# Patient Record
Sex: Male | Born: 1970 | Hispanic: No | Marital: Single | Smoking: Never smoker
Health system: Southern US, Community
[De-identification: ages and names within clinical notes are randomized; demographics above are authoritative.]

## PROBLEM LIST (undated history)

## (undated) HISTORY — PX: CERVICAL SPINE SURGERY: SHX589

---

## 2014-03-12 ENCOUNTER — Ambulatory Visit: Payer: Worker's Compensation | Attending: Orthopedic Surgery | Admitting: Physical Therapy

## 2014-03-12 DIAGNOSIS — M542 Cervicalgia: Secondary | ICD-10-CM | POA: Insufficient documentation

## 2014-03-12 DIAGNOSIS — IMO0001 Reserved for inherently not codable concepts without codable children: Secondary | ICD-10-CM | POA: Insufficient documentation

## 2014-03-20 ENCOUNTER — Ambulatory Visit: Payer: Worker's Compensation | Attending: Orthopedic Surgery | Admitting: Physical Therapy

## 2014-03-20 DIAGNOSIS — M542 Cervicalgia: Secondary | ICD-10-CM | POA: Diagnosis not present

## 2014-03-20 DIAGNOSIS — IMO0001 Reserved for inherently not codable concepts without codable children: Secondary | ICD-10-CM | POA: Insufficient documentation

## 2014-03-24 ENCOUNTER — Ambulatory Visit: Payer: Worker's Compensation | Attending: Orthopedic Surgery | Admitting: Physical Therapy

## 2014-03-24 DIAGNOSIS — M542 Cervicalgia: Secondary | ICD-10-CM | POA: Insufficient documentation

## 2014-03-24 DIAGNOSIS — IMO0001 Reserved for inherently not codable concepts without codable children: Secondary | ICD-10-CM | POA: Insufficient documentation

## 2014-03-25 ENCOUNTER — Ambulatory Visit: Payer: Worker's Compensation | Admitting: Physical Therapy

## 2014-04-01 ENCOUNTER — Ambulatory Visit: Payer: Worker's Compensation | Attending: Orthopedic Surgery | Admitting: Physical Therapy

## 2014-04-01 DIAGNOSIS — IMO0001 Reserved for inherently not codable concepts without codable children: Secondary | ICD-10-CM | POA: Diagnosis not present

## 2014-04-01 DIAGNOSIS — M542 Cervicalgia: Secondary | ICD-10-CM | POA: Insufficient documentation

## 2014-04-03 ENCOUNTER — Ambulatory Visit: Payer: Worker's Compensation | Admitting: Physical Therapy

## 2014-04-03 ENCOUNTER — Encounter: Payer: Self-pay | Admitting: Physical Therapy

## 2014-04-07 ENCOUNTER — Ambulatory Visit: Payer: Worker's Compensation | Attending: Orthopedic Surgery | Admitting: Physical Therapy

## 2014-04-07 DIAGNOSIS — IMO0001 Reserved for inherently not codable concepts without codable children: Secondary | ICD-10-CM | POA: Insufficient documentation

## 2014-04-07 DIAGNOSIS — M542 Cervicalgia: Secondary | ICD-10-CM | POA: Diagnosis not present

## 2014-04-09 ENCOUNTER — Ambulatory Visit: Payer: Worker's Compensation | Attending: Orthopedic Surgery | Admitting: Physical Therapy

## 2014-04-09 DIAGNOSIS — IMO0001 Reserved for inherently not codable concepts without codable children: Secondary | ICD-10-CM | POA: Insufficient documentation

## 2014-04-09 DIAGNOSIS — M542 Cervicalgia: Secondary | ICD-10-CM | POA: Diagnosis not present

## 2014-04-15 ENCOUNTER — Ambulatory Visit: Payer: Self-pay | Admitting: Physical Therapy

## 2014-04-17 ENCOUNTER — Encounter: Payer: Self-pay | Admitting: Physical Therapy

## 2014-06-18 ENCOUNTER — Ambulatory Visit: Payer: Worker's Compensation | Attending: Orthopedic Surgery | Admitting: Physical Therapy

## 2014-06-18 DIAGNOSIS — M25539 Pain in unspecified wrist: Secondary | ICD-10-CM | POA: Insufficient documentation

## 2014-06-18 DIAGNOSIS — M545 Low back pain, unspecified: Secondary | ICD-10-CM | POA: Diagnosis not present

## 2014-06-18 DIAGNOSIS — M542 Cervicalgia: Secondary | ICD-10-CM | POA: Diagnosis present

## 2014-06-20 ENCOUNTER — Ambulatory Visit: Payer: Worker's Compensation | Admitting: Physical Therapy

## 2014-06-26 ENCOUNTER — Ambulatory Visit: Payer: Worker's Compensation | Attending: Orthopedic Surgery | Admitting: Physical Therapy

## 2014-06-26 DIAGNOSIS — M545 Low back pain, unspecified: Secondary | ICD-10-CM | POA: Insufficient documentation

## 2014-06-26 DIAGNOSIS — M542 Cervicalgia: Secondary | ICD-10-CM | POA: Insufficient documentation

## 2014-06-26 DIAGNOSIS — IMO0001 Reserved for inherently not codable concepts without codable children: Secondary | ICD-10-CM | POA: Insufficient documentation

## 2014-06-26 DIAGNOSIS — M25539 Pain in unspecified wrist: Secondary | ICD-10-CM | POA: Diagnosis not present

## 2014-06-26 DIAGNOSIS — Z981 Arthrodesis status: Secondary | ICD-10-CM | POA: Diagnosis not present

## 2014-06-27 ENCOUNTER — Ambulatory Visit: Payer: Worker's Compensation | Admitting: Physical Therapy

## 2014-07-02 ENCOUNTER — Ambulatory Visit: Payer: Worker's Compensation | Admitting: Physical Therapy

## 2014-07-02 DIAGNOSIS — IMO0001 Reserved for inherently not codable concepts without codable children: Secondary | ICD-10-CM | POA: Diagnosis not present

## 2014-07-04 ENCOUNTER — Ambulatory Visit: Payer: Worker's Compensation | Admitting: Physical Therapy

## 2014-07-04 DIAGNOSIS — IMO0001 Reserved for inherently not codable concepts without codable children: Secondary | ICD-10-CM | POA: Diagnosis not present

## 2014-07-08 ENCOUNTER — Ambulatory Visit: Payer: Worker's Compensation | Admitting: Physical Therapy

## 2014-07-08 DIAGNOSIS — IMO0001 Reserved for inherently not codable concepts without codable children: Secondary | ICD-10-CM | POA: Diagnosis not present

## 2014-07-11 ENCOUNTER — Ambulatory Visit: Payer: Worker's Compensation | Admitting: Physical Therapy

## 2014-07-16 ENCOUNTER — Ambulatory Visit: Payer: Worker's Compensation | Attending: Orthopedic Surgery | Admitting: Physical Therapy

## 2014-07-16 DIAGNOSIS — M549 Dorsalgia, unspecified: Secondary | ICD-10-CM | POA: Insufficient documentation

## 2014-07-16 DIAGNOSIS — IMO0001 Reserved for inherently not codable concepts without codable children: Secondary | ICD-10-CM | POA: Insufficient documentation

## 2014-07-16 DIAGNOSIS — Z981 Arthrodesis status: Secondary | ICD-10-CM | POA: Insufficient documentation

## 2014-07-16 DIAGNOSIS — M542 Cervicalgia: Secondary | ICD-10-CM | POA: Diagnosis not present

## 2014-07-16 DIAGNOSIS — M25539 Pain in unspecified wrist: Secondary | ICD-10-CM | POA: Diagnosis not present

## 2014-07-21 ENCOUNTER — Ambulatory Visit: Payer: Worker's Compensation | Admitting: Physical Therapy

## 2014-07-21 DIAGNOSIS — IMO0001 Reserved for inherently not codable concepts without codable children: Secondary | ICD-10-CM | POA: Diagnosis not present

## 2014-07-23 ENCOUNTER — Ambulatory Visit: Payer: Worker's Compensation | Admitting: Physical Therapy

## 2014-07-23 DIAGNOSIS — IMO0001 Reserved for inherently not codable concepts without codable children: Secondary | ICD-10-CM | POA: Diagnosis not present

## 2014-07-25 ENCOUNTER — Ambulatory Visit: Payer: Worker's Compensation | Attending: Orthopedic Surgery | Admitting: Physical Therapy

## 2014-07-25 DIAGNOSIS — M545 Low back pain: Secondary | ICD-10-CM | POA: Diagnosis not present

## 2014-07-25 DIAGNOSIS — M25521 Pain in right elbow: Secondary | ICD-10-CM | POA: Diagnosis not present

## 2014-07-25 DIAGNOSIS — M542 Cervicalgia: Secondary | ICD-10-CM | POA: Insufficient documentation

## 2014-07-25 DIAGNOSIS — Z9889 Other specified postprocedural states: Secondary | ICD-10-CM | POA: Insufficient documentation

## 2014-07-30 ENCOUNTER — Encounter: Payer: Worker's Compensation | Admitting: Physical Therapy

## 2014-08-01 ENCOUNTER — Ambulatory Visit: Payer: Worker's Compensation | Admitting: Physical Therapy

## 2014-08-01 DIAGNOSIS — M542 Cervicalgia: Secondary | ICD-10-CM | POA: Diagnosis not present

## 2015-10-14 ENCOUNTER — Emergency Department (INDEPENDENT_AMBULATORY_CARE_PROVIDER_SITE_OTHER)
Admission: EM | Admit: 2015-10-14 | Discharge: 2015-10-14 | Disposition: A | Discharge status: Home / Self Care | Payer: Worker's Compensation | Source: Home / Self Care | Attending: Family Medicine | Admitting: Family Medicine

## 2015-10-14 ENCOUNTER — Encounter (HOSPITAL_COMMUNITY): Payer: Self-pay | Admitting: *Deleted

## 2015-10-14 DIAGNOSIS — Z4802 Encounter for removal of sutures: Secondary | ICD-10-CM | POA: Diagnosis not present

## 2015-10-14 MED ORDER — BACITRACIN ZINC 500 UNIT/GM EX OINT
TOPICAL_OINTMENT | CUTANEOUS | Status: AC
  Filled 2015-10-14: qty 2.7

## 2015-10-14 NOTE — ED Notes (Signed)
Pt here  For  Suture  Removal  r  Hand   -  Sutures  Have  Been in place  For  3  Weeks

## 2015-10-14 NOTE — ED Provider Notes (Signed)
CSN: [id number]     Arrival date & time 10/14/15  1641 History   First MD Initiated Contact with Patient 10/14/15 1753     Chief Complaint  Patient presents with  . Suture / Staple Removal   (Consider location/radiation/quality/duration/timing/severity/associated sxs/prior Treatment) Patient is a 44 y.o. male presenting with suture removal. The history is provided by the patient.  Suture / Staple Removal This is a new problem. The current episode started more than 1 week ago (carpal tunnel surg in [place] 3 wks ago, healed and no sx except sl numbness to right thumb otherwise nl. here for sr.). The problem has been resolved.    History reviewed. No pertinent past medical history. History reviewed. No pertinent past surgical history. History reviewed. No pertinent family history. Social History  Substance Use Topics  . Smoking status: None  . Smokeless tobacco: None  . Alcohol Use: No    Review of Systems  Skin: Positive for wound.  All other systems reviewed and are negative.   Allergies  Review of patient's allergies indicates no known allergies.  Home Medications   Prior to Admission medications   Not on File   Meds Ordered and Administered this Visit  Medications - No data to display  BP 126/78 mmHg  Pulse 78  Temp(Src) 98.6 F (37 C) (Oral)  Resp 18  SpO2 100% No data found.   Physical Exam  Constitutional: He is oriented to person, place, and time. He appears well-developed and well-nourished. No distress.  Musculoskeletal: Normal range of motion. He exhibits no tenderness.  Incision site healed, no problems. Sutures removed.  Neurological: He is alert and oriented to person, place, and time.  Skin: Skin is warm and dry.  Nursing note and vitals reviewed.   ED Course  Procedures (including critical care time)  Labs Review Labs Reviewed - No data to display  Imaging Review No results found.   Visual Acuity Review  Right Eye Distance:   Left Eye  Distance:   Bilateral Distance:    Right Eye Near:   Left Eye Near:    Bilateral Near:         MDM   1. Encounter for removal of sutures    Sutures removed without diff.    Linna HoffJames D Keelan Pomerleau, MD 10/14/15 [phone]

## 2016-01-13 ENCOUNTER — Ambulatory Visit: Payer: Worker's Compensation | Attending: Orthopedic Surgery | Admitting: Physical Therapy

## 2016-01-25 ENCOUNTER — Ambulatory Visit: Payer: Self-pay | Admitting: Physical Therapy

## 2016-01-25 ENCOUNTER — Encounter: Payer: Self-pay | Admitting: Physical Therapy

## 2016-01-25 ENCOUNTER — Ambulatory Visit: Payer: 59 | Attending: Orthopedic Surgery | Admitting: Physical Therapy

## 2016-01-25 DIAGNOSIS — M79641 Pain in right hand: Secondary | ICD-10-CM | POA: Diagnosis not present

## 2016-01-25 DIAGNOSIS — M25641 Stiffness of right hand, not elsewhere classified: Secondary | ICD-10-CM | POA: Insufficient documentation

## 2016-01-25 NOTE — Therapy (Signed)
[facility] [place] [address] [place], [place], [address] Phone: [phone]   Fax:  [phone]  Physical Therapy Evaluation  Patient Details  Name: Andre Pittman MRN: [id number] Date of Birth: 04/01/1971 Referring Provider: Lilian Kapur  Encounter Date: 01/25/2016      PT End of Session - 01/25/16 0918    Visit Number 1   PT Start Time 0845   PT Stop Time 0935   PT Time Calculation (min) 50 min   Activity Tolerance Patient limited by pain   Behavior During Therapy [facility] for tasks assessed/performed      History reviewed. No pertinent past medical history.  History reviewed. No pertinent past surgical history.  There were no vitals filed for this visit.  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  He has not done anything since the surgery as he reports he is having difficulty with the worker's comp claim to approve therapy.  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 Yes   Pain Score 9    Pain Location Hand   Pain Orientation Right   Pain Descriptors / Indicators Aching;Pins and needles   Pain Type Surgical pain   Pain Radiating Towards some numbness in the thumb and 2nd finger   Pain Onset More than a month ago   Pain Frequency Constant   Aggravating Factors  any movements   Pain Relieving Factors hot shower   Effect of Pain on Daily  Activities reports difficulty getting dressed and doing hair            [facility] PT Assessment - 01/25/16 0001    Assessment   Medical Diagnosis s/p right carpal tunnel release   Referring Provider Lilian Kapur   Onset Date/Surgical Date 09/20/16   Prior Therapy not for hand   Precautions   Precautions None   Balance Screen   Has the patient fallen in the past 6 months No   Has the patient had a decrease in activity level because of a fear of falling?  No   Is the patient reluctant to leave their home because of a fear of falling?  No   Home Environment   Additional Comments has an 36 month old, he cannot lift due to pain   Prior Function   Level of Independence Independent   Vocation On disability   Leisure no exercises   Posture/Postural Control   Posture Comments fwd head, rounded shoulders   ROM / Strength   AROM / PROM / Strength AROM   AROM   AROM Assessment Site Wrist;Elbow;Thumb   Right/Left Elbow Right   Right Elbow Flexion 120   Right Elbow Extension 15   Right/Left Wrist Right   Right Wrist Extension 50 Degrees   Right Wrist  Flexion 50 Degrees   Right/Left Thumb Right   Right Thumb Opposition Digit 5  unable    Strength   Overall Strength Comments right grip strength 35#, left 60#, right wrist and elbow 3+/5 due to pain in the wrist and elbow   Palpation   Palpation comment he is very tender, he has spasms in the upper traps and rhomboids, th thenar eminensce on the right is wasted compared to the left   Ambulation/Gait   Gait Comments he describes back pain with a left leg weakness and usually has to use a cane and uses it in the right hand, however since the surgery he has used it in the left hand and now reports increased pain and tightness in the left upper trap and rhomboid area                   [facility] Treatment/Exercise - 01/25/16 0001    Modalities   Modalities Electrical Stimulation;Moist Heat   Moist Heat Therapy   Number  Minutes Moist Heat 15 Minutes   Moist Heat Location Wrist   Electrical Stimulation   Electrical Stimulation Location left upper trap, right wrist   Electrical Stimulation Action premod   Electrical Stimulation Goals Pain                PT Education - 01/25/16 [phone]    Education provided Yes   Education Details HEP for wrist flexion and extension stretches, finger abduction and opposition   Person(s) Educated Patient   Methods Explanation;Demonstration;Handout   Comprehension Verbalized understanding;Returned demonstration          PT Short Term Goals - 01/25/16 1011    PT SHORT TERM GOAL #1   Title independent with initial HEP   Time 2   Period Weeks   Status New           PT Long Term Goals - 01/25/16 1011    PT LONG TERM GOAL #1   Title decrease pain 50%   Time 12   Period Weeks   Status New   PT LONG TERM GOAL #2   Title increase right grip strength to = the left   Time 12   Period Weeks   Status New   PT LONG TERM GOAL #3   Title increase right wrist AROM to WFL's   Time 12   Period Weeks   Status New   PT LONG TERM GOAL #4   Title report independence with dressing and doing hair   Time 12   Period Weeks   Status New               Plan - 01/25/16 1007    Clinical Impression Statement Patient underwent a right carpal tunnel release surgery on 09/21/15.  He reports that he has had issues getting PT approved through worker's comp, possibly due to him having MD's and surgeries in [place] with the W/C clainm but living here??  He reports that he has tried to move the hand some but is haivng a high rating of pain in the right hand, elbow and arm with some pain in the neck and left shoulder area, this may be due to him having to use his cane in his left hand now due to the surgery on the right hand.  He does have mm atrophy of the thenar eminensce   Pt will benefit from skilled therapeutic intervention in order to improve on the following deficits  Decreased range  of motion;Decreased scar mobility;Decreased strength;Increased edema;Increased muscle spasms;Postural dysfunction;Improper body mechanics;Impaired sensation;Impaired UE functional use;Pain   Rehab Potential Good   PT Frequency 2x / week   PT Duration 12 weeks   PT Treatment/Interventions ADLs/Self Care Home Management;Cryotherapy;Electrical Stimulation;Iontophoresis /ml Dexamethasone;Moist Heat;Therapeutic exercise;Therapeutic activities;Ultrasound;Patient/family education;Manual techniques;Passive range of motion   PT Next Visit Plan slowly add exercises and manual techniques   Consulted and Agree with Plan of Care Patient         Problem List There are no active problems to display for this patient.   Jearld Lesch., PT 01/25/2016, 11:46 AM  [facility]- [address] [place], [place], [address] Phone: [phone]   Fax:  [phone]  Name: Andre Pittman MRN: [id number] Date of Birth: 03/29/1971

## 2016-02-01 ENCOUNTER — Encounter: Payer: Self-pay | Admitting: Physical Therapy

## 2016-02-01 ENCOUNTER — Ambulatory Visit: Payer: Worker's Compensation | Attending: Orthopedic Surgery | Admitting: Physical Therapy

## 2016-02-01 DIAGNOSIS — M25641 Stiffness of right hand, not elsewhere classified: Secondary | ICD-10-CM

## 2016-02-01 DIAGNOSIS — M79641 Pain in right hand: Secondary | ICD-10-CM

## 2016-02-01 NOTE — Therapy (Signed)
[facility] [place] [address] [place], [place], [address] Phone: [phone]   Fax:  [phone]  Physical Therapy Treatment  Patient Details  Name: Andre Pittman MRN: [id number] Date of Birth: 24-Jan-1971 Referring Provider: Lilian Kapur  Encounter Date: 02/01/2016      PT End of Session - 02/01/16 0925    Visit Number 2   PT Start Time 0846   PT Stop Time 0940   PT Time Calculation (min) 54 min   Activity Tolerance Patient tolerated treatment well   Behavior During Therapy [facility] for tasks assessed/performed      History reviewed. No pertinent past medical history.  History reviewed. No pertinent past surgical history.  There were no vitals filed for this visit.      Subjective Assessment - 02/01/16 0847    Subjective No change since Eval   Currently in Pain? Yes   Pain Score 9    Pain Location --  R hand, upper traps and R elbow                         [facility] Adult PT Treatment/Exercise - 02/01/16 0001    Exercises   Exercises Wrist;Hand;Elbow   Elbow Exercises   Other elbow exercises Seated Tband rows green 2x15   Other elbow exercises Hammer curls #4 2x10; Pinches in web 2x10    Hand Exercises   Theraputty - Pinch 2x15   Other Hand Exercises orange egg squeezes 2x20    Other Hand Exercises Triceps Ext yellow Tband 2x15   Wrist Exercises   Wrist Radial Deviation 15 reps;Right  2 sets   Wrist Ulnar Deviation 15 reps;Right  2 sets    Other wrist exercises Wrist flexion #1 2x15; Wrist Ext #1 2x15   Other wrist exercises Pronation/ supination #1 2x10   Modalities   Modalities Electrical Stimulation;Moist Heat   Moist Heat Therapy   Number Minutes Moist Heat 15 Minutes   Moist Heat Location Wrist   Electrical Stimulation   Electrical Stimulation Location left upper trap, right wrist   Electrical Stimulation Action pre mod   Electrical Stimulation Goals Pain                   PT Short Term Goals - 01/25/16 1011    PT SHORT TERM GOAL #1   Title independent with initial HEP   Time 2   Period Weeks   Status New           PT Long Term Goals - 01/25/16 1011    PT LONG TERM GOAL #1   Title decrease pain 50%   Time 12   Period Weeks   Status New   PT LONG TERM GOAL #2   Title increase right grip strength to = the left   Time 12   Period Weeks   Status New   PT LONG TERM GOAL #3   Title increase right wrist AROM to WFL's   Time 12   Period Weeks   Status New   PT LONG TERM GOAL #4   Title report independence with dressing and doing hair   Time 12   Period Weeks   Status New               Plan - 02/01/16 5638    Clinical Impression Statement Pt tolerated a progression to gym level interventions well. Reports L Trap pain due to increase weight bearing in  LUE with SPC. Little wrist pain with today's interventions and demos good  R wrist AROM.   Rehab Potential Good   PT Frequency 2x / week   PT Duration 12 weeks   PT Treatment/Interventions ADLs/Self Care Home Management;Cryotherapy;Electrical Stimulation;Iontophoresis 4mg /ml Dexamethasone;Moist Heat;Therapeutic exercise;Therapeutic activities;Ultrasound;Patient/family education;Manual techniques;Passive range of motion   PT Next Visit Plan slowly add exercises and manual techniques      Patient will benefit from skilled therapeutic intervention in order to improve the following deficits and impairments:  Decreased range of motion, Decreased scar mobility, Decreased strength, Increased edema, Increased muscle spasms, Postural dysfunction, Improper body mechanics, Impaired sensation, Impaired UE functional use, Pain  Visit Diagnosis: Pain in right hand  Stiffness of right hand, not elsewhere classified     Problem List There are no active problems to display for this patient.   Grayce Sessionsonald G Malerie Eakins, PTA  02/01/2016, 9:29 AM  [facility] [place] [address] [place], [place], [address] Phone: [phone]   Fax:  [phone]  Name: Andre MusselJohnny Pittman MRN: [id number] Date of Birth: 07-27-1971

## 2016-02-08 ENCOUNTER — Ambulatory Visit: Payer: 59 | Admitting: Physical Therapy

## 2016-02-10 ENCOUNTER — Ambulatory Visit: Payer: 59 | Admitting: Physical Therapy

## 2016-02-10 ENCOUNTER — Encounter: Payer: Self-pay | Admitting: Physical Therapy

## 2016-02-10 DIAGNOSIS — M79641 Pain in right hand: Secondary | ICD-10-CM | POA: Diagnosis not present

## 2016-02-10 DIAGNOSIS — M25641 Stiffness of right hand, not elsewhere classified: Secondary | ICD-10-CM | POA: Diagnosis not present

## 2016-02-10 NOTE — Therapy (Signed)
[facility] [place] [address] [place], [place], [address] Phone: [phone]   Fax:  [phone]  Physical Therapy Treatment  Patient Details  Name: Andre Pittman MRN: [id number] Date of Birth: [birth year] Referring Provider: Lilian KapurNelson Bonawick  Encounter Date: 02/10/2016      PT End of Session - 02/10/16 0923    Visit Number 3   PT Start Time 0845   PT Stop Time 0925   PT Time Calculation (min) 40 min   Activity Tolerance Patient tolerated treatment well   Behavior During Therapy [facility] for tasks assessed/performed      History reviewed. No pertinent past medical history.  History reviewed. No pertinent past surgical history.  There were no vitals filed for this visit.          [facility] - 02/10/16 0001    Strength   Overall Strength Comments right grip strength 55#, left 75#, right wrist and elbow 4/5                     [facility] Adult PT Treatment/Exercise - 02/10/16 0001    Exercises   Exercises Wrist;Hand;Elbow   Elbow Exercises   Other elbow exercises Seated Tband rows green 2x15   Other elbow exercises Hammer curls #5 2x10; Pinches in web 2x15; wall pushups x10    Hand Exercises   Theraputty - Pinch 2x15   Other Hand Exercises UBE L2 3 frd/3rev; orange egg squeezes 2x20    Other Hand Exercises Tricept Ext red Tband 2x15   Wrist Exercises   Wrist Radial Deviation 15 reps;Right  2 sets   Wrist Ulnar Deviation 15 reps;Right  2 sets   Other wrist exercises Wrist flexion #1 2x15; Wrist Ext #1 2x15   Other wrist exercises Pronation/ supination #1 2x10                  PT Short Term Goals - 01/25/16 1011    PT SHORT TERM GOAL #1   Title independent with initial HEP   Time 2   Period Weeks   Status New           PT Long Term Goals - 01/25/16 1011    PT LONG TERM GOAL #1   Title decrease pain 50%   Time 12   Period Weeks   Status New   PT LONG TERM GOAL #2   Title increase right grip strength to = the left   Time 12   Period Weeks   Status New   PT LONG TERM GOAL #3   Title increase right wrist AROM to WFL's   Time 12   Period Weeks   Status New   PT LONG TERM GOAL #4   Title report independence with dressing and doing hair   Time 12   Period Weeks   Status New               Plan - 02/10/16 [id number]    Clinical Impression Statement Pt reports pain in R elbow with wrist exercises but able to push through. Pt has progressed with strength in R wrist and arm strength. Pt required increase time to complete task this date. Continuously reports back and neck pain throughout  treatment. Pt deny modality post treatment.   Rehab Potential Good   PT Frequency 2x / week   PT Duration 12 weeks   PT Treatment/Interventions ADLs/Self Care Home Management;Cryotherapy;Electrical Stimulation;Iontophoresis 4mg /ml Dexamethasone;Moist Heat;Therapeutic exercise;Therapeutic activities;Ultrasound;Patient/family education;Manual  techniques;Passive range of motion   PT Next Visit Plan slowly add exercises and manual techniques      Patient will benefit from skilled therapeutic intervention in order to improve the following deficits and impairments:  Decreased range of motion, Decreased scar mobility, Decreased strength, Increased edema, Increased muscle spasms, Postural dysfunction, Improper body mechanics, Impaired sensation, Impaired UE functional use, Pain  Visit Diagnosis: Pain in right hand  Stiffness of right hand, not elsewhere classified     Problem List There are no active problems to display for this patient.   Andre Pittman, PTA  02/10/2016, 9:27 AM  [facility] [place] [address] [place], [place], [address] Phone: [phone]   Fax:  [phone]  Name: Andre Pittman MRN: [id number] Date of Birth: 08-29-1971

## 2016-02-18 ENCOUNTER — Ambulatory Visit: Payer: 59 | Admitting: Physical Therapy

## 2016-02-22 ENCOUNTER — Ambulatory Visit: Payer: Worker's Compensation | Attending: Orthopedic Surgery | Admitting: Physical Therapy

## 2016-02-22 ENCOUNTER — Encounter: Payer: Self-pay | Admitting: Physical Therapy

## 2016-02-22 DIAGNOSIS — M25641 Stiffness of right hand, not elsewhere classified: Secondary | ICD-10-CM | POA: Diagnosis not present

## 2016-02-22 DIAGNOSIS — M79641 Pain in right hand: Secondary | ICD-10-CM | POA: Diagnosis not present

## 2016-02-22 NOTE — Therapy (Signed)
[facility] [place] [address] [place], [place], [address] Phone: [phone]   Fax:  [phone]  Physical Therapy Treatment  Patient Details  Name: Andre Pittman MRN: [id number] Date of Birth: [birth year] Referring Provider: Lilian KapurNelson Bonawick  Encounter Date: 02/22/2016      PT End of Session - 02/22/16 0841    Visit Number 4   PT Start Time 0800   PT Stop Time 0841   PT Time Calculation (min) 41 min   Activity Tolerance Patient tolerated treatment well;Patient limited by pain      History reviewed. No pertinent past medical history.  History reviewed. No pertinent past surgical history.  There were no vitals filed for this visit.      Subjective Assessment - 02/22/16 0803    Subjective Getting over this cold over the weekend   Currently in Pain? Yes   Pain Score 9    Pain Location Back            [facility] PT Assessment - 02/22/16 0001    AROM   Right/Left Elbow Right   Right Elbow Flexion 121   Right Elbow Extension 19   Right/Left Wrist Right   Right Wrist Extension 60 Degrees   Right Wrist Flexion 51 Degrees   Right/Left Thumb Right   Right Thumb Opposition Digit 5  unable but close   Strength   Overall Strength Comments right grip strength 30#, left 40#, right elbow 4/5; R wrist 4-/5                     [facility] Adult PT Treatment/Exercise - 02/22/16 0001    Exercises   Exercises Wrist;Hand;Elbow   Elbow Exercises   Other elbow exercises Seated Tband rows green 2x15   Other elbow exercises Hammer curls #5 2x10; Pinches in web 2x15;   Hand Exercises   Theraputty - Pinch 2x15   Other Hand Exercises UBE L2 3 frd/3rev; orange egg squeezes 2x20    Other Hand Exercises Tricept Ext red Tband 2x15   Wrist Exercises   Wrist Radial Deviation 15 reps;Right  2 sets yellow tband   Wrist Ulnar Deviation 15 reps;Right  2 sets yellow tband   Other wrist exercises Wrist flexion #2 2x15; Wrist Ext  #2 2x15   Other wrist exercises Velcro board x5 with 2 types of rollers                   PT Short Term Goals - 01/25/16 1011    PT SHORT TERM GOAL #1   Title independent with initial HEP   Time 2   Period Weeks   Status New           PT Long Term Goals - 01/25/16 1011    PT LONG TERM GOAL #1   Title decrease pain 50%   Time 12   Period Weeks   Status New   PT LONG TERM GOAL #2   Title increase right grip strength to = the left   Time 12   Period Weeks   Status New   PT LONG TERM GOAL #3   Title increase right wrist AROM to WFL's   Time 12   Period Weeks   Status New   PT LONG TERM GOAL #4   Title report independence with dressing and doing hair   Time 12   Period Weeks   Status New  Plan - 02/22/16 1610    Clinical Impression Statement Pt with reports of pain throughout treatment. Unable to perform seated chest press due to R elbow pain. Pt also unable to perform seated rows due to low back pain. Pt reports that he does avoid using R wrist/arm at time due to a numb sensation. Pt grip strength has both decrease with both UE.  Pt advises to uses R UE more at home.   PT Frequency 2x / week   PT Duration 12 weeks   PT Treatment/Interventions ADLs/Self Care Home Management;Cryotherapy;Electrical Stimulation;Iontophoresis /ml Dexamethasone;Moist Heat;Therapeutic exercise;Therapeutic activities;Ultrasound;Patient/family education;Manual techniques;Passive range of motion   PT Next Visit Plan R wrist strenght and ROM      Patient will benefit from skilled therapeutic intervention in order to improve the following deficits and impairments:  Decreased range of motion, Decreased scar mobility, Decreased strength, Increased edema, Increased muscle spasms, Postural dysfunction, Improper body mechanics, Impaired sensation, Impaired UE functional use, Pain  Visit Diagnosis: Pain in right hand  Stiffness of right hand, not elsewhere  classified     Problem List There are no active problems to display for this patient.   Grayce Sessions, PTA  02/22/2016, 8:46 AM  [facility] [place] [address] [place], [place], [address] Phone: [phone]   Fax:  [phone]  Name: Andre Pittman MRN: [id number] Date of Birth: August 12, 1971

## 2016-02-29 ENCOUNTER — Ambulatory Visit: Payer: Worker's Compensation | Admitting: Physical Therapy

## 2016-03-16 ENCOUNTER — Ambulatory Visit: Payer: Worker's Compensation | Admitting: Physical Therapy

## 2016-03-16 ENCOUNTER — Encounter: Payer: Self-pay | Admitting: Physical Therapy

## 2016-03-16 DIAGNOSIS — M25641 Stiffness of right hand, not elsewhere classified: Secondary | ICD-10-CM

## 2016-03-16 DIAGNOSIS — M79641 Pain in right hand: Secondary | ICD-10-CM

## 2016-03-16 NOTE — Therapy (Signed)
[place], [place], [address] Phone: [phone]   Fax:  [phone]  Physical Therapy Treatment  Patient Details  Name: Andre Pittman MRN: [id number] Date of Birth: Dec 18, 1970 Referring Provider: Shon Millet  Encounter Date: 03/16/2016      PT End of Session - 03/16/16 0828    Visit Number 5   PT Start Time 0805   PT Stop Time 0900   PT Time Calculation (min) 55 min   Activity Tolerance Patient tolerated treatment well;Patient limited by pain   Behavior During Therapy [facility] for tasks assessed/performed      History reviewed. No pertinent past medical history.  History reviewed. No pertinent past surgical history.  There were no vitals filed for this visit.      Subjective Assessment - 03/16/16 0817    Subjective Patient reports that the wet and cooler weather really is bothering his hand.  He reports that he has numbness and tingling in the hand even with using the TV remote.  He continues to report right elbow pain.   Currently in Pain? Yes   Pain Score 9    Pain Location Hand   Pain Orientation Right   Pain Descriptors / Indicators Aching;Pins and needles   Pain Radiating Towards some numbness in the right thumb and c/o weakness   Aggravating Factors  motions and cold weather, he continues to c/o pain in th eright elbow.   Pain Relieving Factors heat            [facility] PT Assessment - 03/16/16 0001    AROM   Right Wrist Extension 58 Degrees   Right Wrist Flexion 55 Degrees   Strength   Overall Strength Comments right grip 35#                     [facility] Adult PT Treatment/Exercise - 03/16/16 0001    Elbow Exercises   Other elbow exercises Seated Tband rows green 2x15   Other elbow exercises Hammer curls #5 2x10; Pinches in web 2x15;   Hand Exercises   Theraputty - Pinch 2x15   Other Hand Exercises Nustep Level 4 x 5 minutes   Wrist Exercises   Wrist  Radial Deviation 15 reps;Right   Wrist Ulnar Deviation 15 reps;Right   Other wrist exercises Wrist flexion #2 2x15; Wrist Ext #2 2x15   Other wrist exercises Velcro board x5 with 2 types of rollers , hammer UD and RD, hammer pronation and supination   Moist Heat Therapy   Number Minutes Moist Heat 15 Minutes   Moist Heat Location Wrist   Electrical Stimulation   Electrical Stimulation Location right wrist   Electrical Stimulation Action IFC   Electrical Stimulation Goals Pain                  PT Short Term Goals - 03/16/16 0831    PT SHORT TERM GOAL #1   Title independent with initial HEP   Status Achieved           PT Long Term Goals - 03/16/16 0831    PT LONG TERM GOAL #1   Title decrease pain 50%   Status On-going   PT LONG TERM GOAL #2   Title increase right grip strength to = the left   Status On-going   PT LONG TERM GOAL #3   Title increase right wrist AROM to WFL's   Status On-going   PT LONG  TERM GOAL #4   Title report independence with dressing and doing hair   Status Partially Met               Plan - 03/16/16 0829    Clinical Impression Statement Patient continues to have pain in the right hand with some numbness, he also c/o pain in the right elbow.  His ROM has very slowly improved some but is still limited, the right grip strength is about the same as at evaluation with pain   Consulted and Agree with Plan of Care Patient      Patient will benefit from skilled therapeutic intervention in order to improve the following deficits and impairments:  Decreased range of motion, Decreased scar mobility, Decreased strength, Increased edema, Increased muscle spasms, Postural dysfunction, Improper body mechanics, Impaired sensation, Impaired UE functional use, Pain  Visit Diagnosis: Pain in right hand  Stiffness of right hand, not elsewhere classified     Problem List There are no active problems to display for this  patient.   Sumner Boast., PT 03/16/2016, 8:37 AM  [place], [place], [address] Phone: [phone]   Fax:  [phone]  Name: Hilding Quintanar MRN: [id number] Date of Birth: 11-20-1970

## 2016-05-01 ENCOUNTER — Emergency Department (HOSPITAL_COMMUNITY)
Admission: EM | Admit: 2016-05-01 | Discharge: 2016-05-01 | Disposition: A | Discharge status: Home / Self Care | Payer: Commercial Managed Care - HMO | Attending: Emergency Medicine | Admitting: Emergency Medicine

## 2016-05-01 ENCOUNTER — Encounter (HOSPITAL_COMMUNITY): Payer: Self-pay | Admitting: *Deleted

## 2016-05-01 DIAGNOSIS — Y999 Unspecified external cause status: Secondary | ICD-10-CM | POA: Insufficient documentation

## 2016-05-01 DIAGNOSIS — S01112A Laceration without foreign body of left eyelid and periocular area, initial encounter: Secondary | ICD-10-CM | POA: Diagnosis not present

## 2016-05-01 DIAGNOSIS — Y939 Activity, unspecified: Secondary | ICD-10-CM | POA: Diagnosis not present

## 2016-05-01 DIAGNOSIS — Y929 Unspecified place or not applicable: Secondary | ICD-10-CM | POA: Diagnosis not present

## 2016-05-01 DIAGNOSIS — S0181XA Laceration without foreign body of other part of head, initial encounter: Secondary | ICD-10-CM

## 2016-05-01 DIAGNOSIS — W228XXA Striking against or struck by other objects, initial encounter: Secondary | ICD-10-CM | POA: Diagnosis not present

## 2016-05-01 NOTE — Discharge Instructions (Signed)
Keep wound clean with mild soap and water. Keep area covered with a topical bandage, keep bandage dry, and do not submerge in water for 24 hours. DO NOT USE CREAMS OR OINTMENTS ON THE WOUND! Ice and elevate for additional pain relief and swelling. Alternate between Ibuprofen and Tylenol for additional pain relief. Follow up with [facility] or use the list of doctors below to find a regular doctor in approximately 7 days for wound recheck and to establish medical care. Monitor area for signs of infection to include, but not limited to: increasing pain, spreading redness, drainage/pus, worsening swelling, or fevers. Return to emergency department for emergent changing or worsening symptoms.   Facial Laceration A facial laceration is a cut on the face. These injuries can be painful and cause bleeding. Some cuts may need to be closed with stitches (sutures), skin adhesive strips, or wound glue. Cuts usually heal quickly but can leave a scar. It can take 1-2 years for the scar to go away completely. HOME CARE   Only take medicines as told by your doctor.  Follow your doctor's instructions for wound care. For Stitches:  Keep the cut clean and dry.  If you have a bandage (dressing), change it at least once a day. Change the bandage if it gets wet or dirty, or as told by your doctor.  Wash the cut with soap and water 2 times a day. Rinse the cut with water. Pat it dry with a clean towel.  Put a thin layer of medicated cream on the cut as told by your doctor.  You may shower after the first 24 hours. Do not soak the cut in water until the stitches are removed.  Have your stitches removed as told by your doctor.  Do not wear any makeup until a few days after your stitches are removed. For Skin Adhesive Strips:  Keep the cut clean and dry.  Do not get the strips wet. You may take a bath, but be careful to keep the cut dry.  If the cut gets wet, pat it dry with a clean towel.  The  strips will fall off on their own. Do not remove the strips that are still stuck to the cut. For Wound Glue:  You may shower or take baths. Do not soak or scrub the cut. Do not swim. Avoid heavy sweating until the glue falls off on its own. After a shower or bath, pat the cut dry with a clean towel.  Do not put medicine or makeup on your cut until the glue falls off.  If you have a bandage, do not put tape over the glue.  Avoid lots of sunlight or tanning lamps until the glue falls off.  The glue will fall off on its own in 5-10 days. Do not pick at the glue. After Healing:  Put sunscreen on the cut for the first year to reduce your scar. GET HELP IF:  You have a fever. GET HELP RIGHT AWAY IF:   Your cut area gets red, painful, or puffy (swollen).  You see a yellowish-white fluid (pus) coming from the cut.   This information is not intended to replace advice given to you by your health care provider. Make sure you discuss any questions you have with your health care provider.   Document Released: 03/28/2008 Document Revised: 10/31/2014 Document Reviewed: 05/23/2013 Elsevier Interactive Patient Education 2016 [employer].  Stitches, [place], or Adhesive Wound Closure Doctors use stitches (sutures), staples, and  certain glue (skin adhesives) to hold your skin together while it heals (wound closure). You may need this treatment after you have surgery or if you cut your skin accidentally. These methods help your skin heal more quickly. They also make it less likely that you will have a scar. WHAT ARE THE DIFFERENT KINDS OF WOUND CLOSURES? There are many options for wound closure. The one that your doctor uses depends on how deep and large your wound is. Adhesive Glue To use this glue to close a wound, your doctor holds the edges of the wound together and paints the glue on the surface of your skin. You may need more than one layer of glue. Then the wound may be covered with a light  bandage (dressing). This type of skin closure may be used for small wounds that are not deep (superficial). Using glue for wound closure is less painful than other methods. It does not require a medicine that numbs the area. This method also leaves nothing to be removed. Adhesive glue is often used for children and on facial wounds. Adhesive glue cannot be used for wounds that are deep, uneven, or bleeding. It is not used inside of a wound.  Adhesive Strips These strips are made of sticky (adhesive), porous paper. They are placed across your skin edges like a regular adhesive bandage. You leave them on until they fall off. Adhesive strips may be used to close very superficial wounds. They may also be used along with sutures to improve closure of your skin edges.  Sutures Sutures are the oldest method of wound closure. Sutures can be made from natural or synthetic materials. They can be made from a material that your body can break down as your wound heals (absorbable), or they can be made from a material that needs to be removed from your skin (nonabsorbable). They come in many different strengths and sizes. Your doctor attaches the sutures to a steel needle on one end. Sutures can be passed through your skin, or through the tissues beneath your skin. Then they are tied and cut. Your skin edges may be closed in one continuous stitch or in separate stitches. Sutures are strong and can be used for all kinds of wounds. Absorbable sutures may be used to close tissues under the skin. The disadvantage of sutures is that they may cause skin reactions that lead to infection. Nonabsorbable sutures need to be removed. Staples When surgical staples are used to close a wound, the edges of your skin on both sides of the wound are brought close together. A staple is placed across the wound, and an instrument secures the edges together. Staples are often used to close surgical cuts (incisions). Staples are faster to  use than sutures, and they cause less reaction from your skin. Staples need to be removed using a tool that bends the staples away from your skin. HOW DO I CARE FOR MY WOUND CLOSURE?  Take medicines only as told by your doctor.  If you were prescribed an antibiotic medicine for your wound, finish it all even if you start to feel better.  Use ointments or creams only as told by your doctor.  Wash your hands with soap and water before and after touching your wound.  Do not soak your wound in water. Do not take baths, swim, or use a hot tub until your doctor says it is okay.  Ask your doctor when you can start showering. Cover your wound if told by your  doctor.  Do not take out your own sutures or staples.  Do not pick at your wound. Picking can cause an infection.  Keep all follow-up visits as told by your doctor. This is important. HOW LONG WILL I HAVE MY WOUND CLOSURE?   Leave adhesive glue on your skin until the glue peels away.  Leave adhesive strips on your skin until they fall off.  Absorbable sutures will dissolve within several days.  Nonabsorbable sutures and staples must be removed. The location of the wound will determine how long they stay in. This can range from several days to a couple of weeks. WHEN SHOULD I SEEK HELP FOR MY WOUND CLOSURE? Contact your doctor if:  You have a fever.  You have chills.  You have redness, puffiness (swelling), or pain at the site of your wound.  You have fluid, blood, or pus coming from your wound.  There is a bad smell coming from your wound.  The skin edges of your wound start to separate after your sutures have been removed.  Your wound becomes thick, raised, and darker in color after your sutures come out (scarring).   This information is not intended to replace advice given to you by your health care provider. Make sure you discuss any questions you have with your health care provider.   Document Released: 08/07/2009  Document Revised: 10/31/2014 Document Reviewed: 03/19/2014 Elsevier Interactive Patient Education 2016 [employer].   Emergency Department Resource Guide 1) Find a Doctor and Pay Out of Pocket Although you won't have to find out who is covered by your insurance plan, it is a good idea to ask around and get recommendations. You will then need to call the office and see if the doctor you have chosen will accept you as a new patient and what types of options they offer for patients who are self-pay. Some doctors offer discounts or will set up payment plans for their patients who do not have insurance, but you will need to ask so you aren't surprised when you get to your appointment.  2) Contact Your Local Health Department Not all health departments have doctors that can see patients for sick visits, but many do, so it is worth a call to see if yours does. If you don't know where your local health department is, you can check in your phone book. The CDC also has a tool to help you locate your state's health department, and many state websites also have listings of all of their local health departments.  3) Find a Walk-in Clinic If your illness is not likely to be very severe or complicated, you may want to try a walk in clinic. These are popping up all over the country in pharmacies, drugstores, and shopping centers. They're usually staffed by nurse practitioners or physician assistants that have been trained to treat common illnesses and complaints. They're usually fairly quick and inexpensive. However, if you have serious medical issues or chronic medical problems, these are probably not your best option.  No Primary Care Doctor: - Call Health Connect at  [phone] - they can help you locate a primary care doctor that  accepts your insurance, provides certain services, etc. - Physician Referral Service- [phone]  Chronic Pain Problems: Organization         Address  Phone   Notes  [facility]  [phone] Patients need to be referred by their primary care doctor.   Medication Assistance: Organization  Address  Phone   Notes  [facility] Medication [facility] [address] [place], [place] [address] [phone] --Must be a resident of [facility] -- Must have NO insurance coverage whatsoever (no Medicaid/ Medicare, etc.) -- The pt. MUST have a primary care doctor that directs their care regularly and follows them in the community   [facility]  [phone]   [employer]  [phone]    Agencies that provide inexpensive medical care: Organization         Address  Phone   Notes  [facility]  [phone]   [facility]    [phone]   [facility] [address] [place], [place] [address] [phone]   [facility] [address], [place] [phone]   [facility]    [phone]   [facility]    [phone]   [facility]  [address], [place] Phone:  [phone], Fax:  [phone] Hours of Operation:  9 am - 6 pm, M-F.  Also accepts Medicaid/Medicare and self-pay.  [facility]  [address], [place] Phone: [phone], Fax: [phone]. Hours of Operation:  8:30 am - 5:30 pm, M-F.  Also accepts Medicaid and self-pay.  [facility] [place] [address], [place] Phone: [phone]   [facility] [address] Natasha Bence [place], [place] [phone], Ext. [phone] Mondays & Thursdays: 7-9 AM.  First 15 patients are seen on a first come, first serve basis.    Medicaid-accepting [facility] Providers:  Organization         Address  Phone   Notes  [facility] [address], [place] [phone] Also accepts self-pay patients.  [facility] [address] Laurell Josephs [place], [place]  [phone]   [facility] [address], [place] [phone]   [facility] [address] [place] [phone]   Renaye Rakers [address], [place]   [phone] Only accepts [place] patients after they have their name applied to their card.   Self-Pay (no insurance) in [facility]:  Organization         Address  Phone   Notes  Sickle Cell Patients, [facility] [address] [place], [place] [phone]   [facility] [address] [place], [place] [phone]   [facility]   [address],  [phone]   [facility]/Dr. Osei-Bonsu  [address] [place] or [address], [place] [phone] Phone number for both [place] and [place] locations is the same.  [facility] [address], [place] [phone]   [facility] [address], [place] or [address] [phone]   [facility] [address] [place] [phone], phone; [phone], fax Sees patients 1st and 3rd Saturday of every month.  Must not qualify for public or private insurance (i.e. Medicaid, Medicare, [address] Health Choice, Veterans' Benefits)  Household income should be no more than 200% of the poverty level The clinic cannot treat you if you are pregnant or think you are  pregnant  Sexually transmitted diseases are not treated at the clinic.

## 2016-05-01 NOTE — ED Provider Notes (Signed)
CSN: [id number]     Arrival date & time 05/01/16  8657 History  By signing my name below, I, Soijett Blue, attest that this documentation has been prepared under the direction and in the presence of [employer], [employer] Electronically Signed: Soijett Blue, ED Scribe. 05/01/2016. 10:09 AM.   Chief Complaint  Patient presents with  . Facial Laceration      Patient is a 45 y.o. male presenting with skin laceration. The history is provided by the patient. No language interpreter was used.  Laceration Location:  Face Facial laceration location:  L eyebrow Length (cm):  1.5 cm  Depth:  Cutaneous Quality: straight   Bleeding: controlled   Time since incident: PTA. Injury mechanism: elbow. Pain details:    Quality: stinging.   Severity:  Mild   Timing:  Intermittent   Progression:  Unchanged Foreign body present:  No foreign bodies Relieved by:  Pressure Exacerbated by: air. Ineffective treatments:  None tried Tetanus status:  Up to date   Andre Pittman is a 45 y.o. male with no significant medical problems, who presents to the Emergency department complaining of facial laceration onset PTA. He reports that he was playing with his younger child when his older child jumped on the pt and accidentally struck the pt in the left eyebrow with his elbow. Pt reports mild pain to the wound, describes as 3-4/10, stinging sensation at the wound, intermittent, and it doesn't radiate. He states that air flow past the wound worsens his pain. Pt notes that his last tetanus vaccination was 4-5 years ago. He reports that he has tried a bandage to the area without medications for the relief for his symptoms. He denies vision change, facial pain, blurred vision, dental pain/loosening, LOC, bleeding conditions/anticoagulant use/easy bruising, HA, CP, SOB, abdominal pain, n/v, neck pain, back pain, numbness, tingling, weakness, and any other symptoms. Pt denies taking blood thinners.   No past medical  history on file. No past surgical history on file. No family history on file. Social History  Substance Use Topics  . Smoking status: Not on file  . Smokeless tobacco: Not on file  . Alcohol Use: No    Review of Systems  HENT: Positive for facial swelling (L eyebrow, minimal). Negative for dental problem.   Eyes: Negative for visual disturbance.  Respiratory: Negative for shortness of breath.   Cardiovascular: Negative for chest pain.  Gastrointestinal: Negative for nausea, vomiting and abdominal pain.  Musculoskeletal: Negative for back pain and neck pain.  Skin: Positive for wound (laceration to left eyelid).  Allergic/Immunologic: Negative for immunocompromised state.  Neurological: Negative for syncope, weakness, light-headedness, numbness and headaches.  Hematological: Does not bruise/bleed easily.  Psychiatric/Behavioral: Negative for confusion.   A complete 10 system review of systems was obtained and all systems are negative except as noted in the HPI and PMH.    Allergies  Review of patient's allergies indicates no known allergies.  Home Medications   Prior to Admission medications   Not on File   BP 113/77 mmHg  Pulse 70  Temp(Src) 98.2 F (36.8 C) (Oral)  Resp 16  SpO2 97% Physical Exam  Constitutional: He is oriented to person, place, and time. Vital signs are normal. He appears well-developed and well-nourished.  Non-toxic appearance. No distress.  Afebrile, nontoxic, NAD  HENT:  Head: Normocephalic. Head is with laceration. Head is without raccoon's eyes, without Battle's sign and without contusion.  Mouth/Throat: Oropharynx is clear and moist and mucous membranes are normal.  Superficial ~  1.5 cm linear laceration to the left eyebrow with minimal swelling, no bruising or contusions, no battle sign or racoon eyes, no scalp or facial tenderness or crepitus. SEE PICTURE BELOW  Eyes: Conjunctivae and EOM are normal. Pupils are equal, round, and reactive to  light. Right eye exhibits no discharge. Left eye exhibits no discharge.  PERRL, EOMI, no nystagmus, no visual field deficits   Neck: Normal range of motion. Neck supple. No spinous process tenderness and no muscular tenderness present. No rigidity. Normal range of motion present.  FROM intact without spinous process TTP, no bony stepoffs or deformities, no paraspinous muscle TTP or muscle spasms. No rigidity or meningeal signs. No bruising or swelling.   Cardiovascular: Normal rate and intact distal pulses.   Pulmonary/Chest: Effort normal. No respiratory distress.  Abdominal: Normal appearance. He exhibits no distension.  Musculoskeletal: Normal range of motion.  MAE x4 Strength and sensation grossly intact Distal pulses intact Gait steady  Neurological: He is alert and oriented to person, place, and time. He has normal strength. No cranial nerve deficit or sensory deficit. Coordination and gait normal. GCS eye subscore is 4. GCS verbal subscore is 5. GCS motor subscore is 6.  CN 2-12 grossly intact A&O x4 GCS 15 Sensation and strength intact Gait nonataxic Coordination WNL  Skin: Skin is warm and dry. Laceration noted. No rash noted.  Left eyebrow laceration as noted above and pictured below.   Psychiatric: He has a normal mood and affect.  Nursing note and vitals reviewed.     ED Course  .Marland KitchenLaceration Repair Date/Time: 05/01/2016 10:16 AM Performed by: Allen Derry Authorized by: Allen Derry Consent: Verbal consent obtained. Risks and benefits: risks, benefits and alternatives were discussed Consent given by: patient Patient understanding: patient states understanding of the procedure being performed Patient consent: the patient's understanding of the procedure matches consent given Patient identity confirmed: verbally with patient Body area: head/neck Location details: left eyebrow Laceration length: 1.5 cm Foreign bodies: no foreign bodies Tendon  involvement: none Nerve involvement: none Vascular damage: no Patient sedated: no Preparation: Patient was prepped and draped in the usual sterile fashion. Irrigation solution: saline Irrigation method: syringe Amount of cleaning: standard Debridement: none Degree of undermining: none Skin closure: glue Approximation: close Approximation difficulty: simple Dressing: 4x4 sterile gauze Patient tolerance: Patient tolerated the procedure well with no immediate complications   (including critical care time) DIAGNOSTIC STUDIES: Oxygen Saturation is 97% on RA, nl by my interpretation.    COORDINATION OF CARE: 10:09 AM Discussed treatment plan with pt at bedside which includes laceration repair with dermabond and pt agreed to plan.     MDM   Final diagnoses:  Facial laceration, initial encounter     45 y.o. male here with L eyebrow lac sustained PTA. UTD on tetanus. No ongoing bleeding. No HA/vision changes/LOC, no focal neuro deficits, doubt need for head imaging. Linear lac will be dermabonded since this will provide minimal scarring. Will reassess after dermabond.  10:22 AM Dermabond successful. Discussed ice/tylenol/motrin for pain/swelling. F/up with [facility] in 1wk to establish care and recheck wound. Doubt need for prophylactic abx. Discussed wound care and sunscreen use starting 10 days from now. I explained the diagnosis and have given explicit precautions to return to the ER including for any other new or worsening symptoms. The patient understands and accepts the medical plan as it's been dictated and I have answered their questions. Discharge instructions concerning home care and prescriptions have been given. The patient is STABLE and  is discharged to home in good condition.   I personally performed the services described in this documentation, which was scribed in my presence. The recorded information has been reviewed and is accurate.  BP 113/77 mmHg  Pulse 70  Temp(Src)  98.2 F (36.8 C) (Oral)  Resp 16  SpO2 97%  No orders of the defined types were placed in this encounter.      Blondina Coderre Camprubi-Soms, PA-C 05/01/16 [address]1025  Iolanda Folson Cypressamprubi-Soms, PA-C 05/01/16 1026  Margarita Grizzleanielle Ray, MD 05/01/16 1734

## 2016-05-01 NOTE — ED Notes (Deleted)
Declined W/C at D/C and was escorted to lobby by RN. 

## 2016-05-01 NOTE — ED Notes (Signed)
Declined W/C at D/C and was escorted to lobby by RN. 

## 2016-05-01 NOTE — ED Notes (Signed)
Pt reports he was it above the Lt eye brow with an elbow. Pt has small lac above lt eye brow. Bleeding controled.

## 2016-07-21 DIAGNOSIS — M542 Cervicalgia: Secondary | ICD-10-CM | POA: Diagnosis not present

## 2016-08-17 ENCOUNTER — Emergency Department (HOSPITAL_COMMUNITY): Payer: Commercial Managed Care - HMO

## 2016-08-17 ENCOUNTER — Emergency Department (HOSPITAL_COMMUNITY)
Admission: EM | Admit: 2016-08-17 | Discharge: 2016-08-17 | Disposition: A | Discharge status: Home / Self Care | Payer: Commercial Managed Care - HMO | Attending: Emergency Medicine | Admitting: Emergency Medicine

## 2016-08-17 ENCOUNTER — Encounter (HOSPITAL_COMMUNITY): Payer: Self-pay

## 2016-08-17 DIAGNOSIS — M7022 Olecranon bursitis, left elbow: Secondary | ICD-10-CM | POA: Diagnosis not present

## 2016-08-17 DIAGNOSIS — Y929 Unspecified place or not applicable: Secondary | ICD-10-CM | POA: Diagnosis not present

## 2016-08-17 DIAGNOSIS — Y999 Unspecified external cause status: Secondary | ICD-10-CM | POA: Diagnosis not present

## 2016-08-17 DIAGNOSIS — Y939 Activity, unspecified: Secondary | ICD-10-CM | POA: Insufficient documentation

## 2016-08-17 DIAGNOSIS — W19XXXA Unspecified fall, initial encounter: Secondary | ICD-10-CM | POA: Insufficient documentation

## 2016-08-17 DIAGNOSIS — M25522 Pain in left elbow: Secondary | ICD-10-CM | POA: Diagnosis not present

## 2016-08-17 DIAGNOSIS — S59902A Unspecified injury of left elbow, initial encounter: Secondary | ICD-10-CM | POA: Diagnosis not present

## 2016-08-17 DIAGNOSIS — M7989 Other specified soft tissue disorders: Secondary | ICD-10-CM | POA: Diagnosis not present

## 2016-08-17 MED ORDER — MELOXICAM 15 MG PO TABS: 15.0000 mg | ORAL_TABLET | Freq: Every day | ORAL | 0 refills | Status: AC

## 2016-08-17 NOTE — ED Triage Notes (Signed)
Patient complains of left elbow pain with swelling x 2 months post fall, pain with ROM

## 2016-08-17 NOTE — ED Provider Notes (Signed)
[facility]-EMERGENCY DEPT Provider Note   CSN: [id number] Arrival date & time: 08/17/16  1326  By signing my name below, I, Andre Pittman, attest that this documentation has been prepared under the direction and in the presence of Andre Symmonds, PA-C. Electronically Signed: Sonum Pittman, Neurosurgeoncribe. 08/17/16. 2:43 PM.  History   Chief Complaint No chief complaint on file.   The history is provided by the patient. No language interpreter was used.     HPI Comments: Andre Pittman is a 45 y.o. male who presents to the Emergency Department complaining of persistent left elbow pain with swelling for the past 2 months. He states he initially injured the area after a fall. He has chronic pain for which he takes Norco which provides him relief. He states the pain is worse with certain movements. He denies weakness, numbness.   History reviewed. No pertinent past medical history.  There are no active problems to display for this patient.   History reviewed. No pertinent surgical history.     Home Medications    Prior to Admission medications   Not on File    Family History No family history on file.  Social History Social History  Substance Use Topics  . Smoking status: Never Smoker  . Smokeless tobacco: Never Used  . Alcohol use No     Allergies   Review of patient's allergies indicates no known allergies.   Review of Systems Review of Systems  Musculoskeletal: Positive for arthralgias and joint swelling.  Neurological: Negative for weakness and numbness.     Physical Exam Updated Vital Signs BP 103/70   Pulse 68   Temp 98.1 F (36.7 C)   Resp 18   SpO2 98%   Physical Exam  Constitutional: He is oriented to person, place, and time. He appears well-developed and well-nourished.  HENT:  Head: Normocephalic and atraumatic.  Cardiovascular: Normal rate.   Pulmonary/Chest: Effort normal.  Musculoskeletal:  Full range of motion of the left elbow. Swelling over  olecranon bursa. There is no erythema or warmth to the touch. There is some tenderness of her bursa. Normal wrist. Distal radial pulses intact.  Neurological: He is alert and oriented to person, place, and time.  Skin: Skin is warm and dry.  Psychiatric: He has a normal mood and affect.  Nursing note and vitals reviewed.    ED Treatments / Results  DIAGNOSTIC STUDIES: Oxygen Saturation is 98% on RA, normal by my interpretation.    COORDINATION OF CARE: 2:43 PM Discussed treatment plan with pt at bedside and pt agreed to plan.   Labs (all labs ordered are listed, but only abnormal results are displayed) Labs Reviewed - No data to display  EKG  EKG Interpretation None       Radiology No results found.  Procedures Procedures (including critical care time)  Medications Ordered in ED Medications - No data to display   Initial Impression / Assessment and Plan / ED Course  I have reviewed the triage vital signs and the nursing notes.  Pertinent labs & imaging results that were available during my care of the patient were reviewed by me and considered in my medical decision making (see chart for details).  Clinical Course   Patient emergency department with left elbow injury 2 months ago. Now with swelling to the left elbow. Exam was consistent with olecranon bursitis. Will x-ray to rule out any elbow injury from the fall. He is otherwise neurovascularly intact. Plan to treat with Ace wrap, NSAIDs. Follow-up  with orthopedics.  Vitals:   08/17/16 1335  BP: 103/70  Pulse: 68  Resp: 18  Temp: 98.1 F (36.7 C)  SpO2: 98%     Final Clinical Impressions(s) / ED Diagnoses   Final diagnoses:  Olecranon bursitis of left elbow    New Prescriptions Discharge Medication List as of 08/17/2016  4:07 PM    START taking these medications   Details  meloxicam (MOBIC) 15 MG tablet Take 1 tablet (15 mg total) by mouth daily., Starting Wed 08/17/2016, Print      I personally  performed the services described in this documentation, which was scribed in my presence. The recorded information has been reviewed and is accurate.    Jaynie Crumble, PA-C 08/18/16 1629    Benjiman Core, MD 08/19/16 1729

## 2016-08-17 NOTE — Discharge Instructions (Signed)
Ice and elevate your arm. ACE wrap to help swelling go down. Follow up with orthopedics.

## 2017-11-15 ENCOUNTER — Ambulatory Visit: Payer: Worker's Compensation | Attending: Orthopedic Surgery | Admitting: Physical Therapy

## 2017-11-15 ENCOUNTER — Other Ambulatory Visit: Payer: Self-pay

## 2017-11-15 ENCOUNTER — Encounter: Payer: Self-pay | Admitting: Physical Therapy

## 2017-11-15 DIAGNOSIS — M25641 Stiffness of right hand, not elsewhere classified: Secondary | ICD-10-CM

## 2017-11-15 DIAGNOSIS — M79641 Pain in right hand: Secondary | ICD-10-CM

## 2017-11-15 DIAGNOSIS — M25521 Pain in right elbow: Secondary | ICD-10-CM | POA: Diagnosis not present

## 2017-11-15 DIAGNOSIS — M25621 Stiffness of right elbow, not elsewhere classified: Secondary | ICD-10-CM | POA: Insufficient documentation

## 2017-11-15 NOTE — Therapy (Signed)
[facility] [address] [place], [place], [address] Phone: [phone]   Fax:  [phone]  Physical Therapy Evaluation  Patient Details  Name: Andre Pittman MRN: [id number] Date of Birth: January 04, 1971 Referring Provider: Violet BaldyNelson Botwinick   Encounter Date: 11/15/2017  PT End of Session - 11/15/17 1128    Visit Number  1    Date for PT Re-Evaluation  01/13/18    Authorization Type  [place] W/C    PT Start Time  1100    PT Stop Time  1140    PT Time Calculation (min)  40 min    Activity Tolerance  Patient tolerated treatment well;Patient limited by pain    Behavior During Therapy  [facility] for tasks assessed/performed       History reviewed. No pertinent past medical history.  History reviewed. No pertinent surgical history.  There were no vitals filed for this visit.   Subjective Assessment - 11/15/17 1100    Subjective  Patient underwent a right ulnar nerve transposition, had tenosynovitis on the 1st and 2nd digits.  The surgery was done Novemeber 15, 2018, had stitches removed 09/20/17.  He reports that he had been having some pain for a number of years, 2011.  He had carpal tunnel 2016.  He reports pain since the surgery.  He had cervical fusion surgery in 2015.  He reports that after the recent surgery he was in a sling and a cast for about 6 weeks.    Limitations  Lifting;House hold activities;Writing    Patient Stated Goals  have less pain, be able to use the hand and arm without difficulty    Currently in Pain?  Yes    Pain Score  9     Pain Location  Elbow    Pain Orientation  Right;Medial    Pain Descriptors / Indicators  Aching;Sharp;Pins and needles;Tingling;Numbness    Pain Type  Acute pain;Surgical pain    Pain Onset  More than a month ago    Pain Frequency  Constant    Aggravating Factors   reports that any motions increase the pain, pressure on the elbow, pain a 9-10/10    Pain Relieving Factors  warm  weather feels a little better, not using it, pain meds, taking oxycodone and gabapentin, pain at best can be 7/10    Effect of Pain on Daily Activities  difficulty with all ADL's         [facility] Assessment - 11/15/17 0001      Assessment   Medical Diagnosis  s/p right ulnar nerve transposition, and tenosynovits of the 1st and 2nd digits    Referring Provider  Violet BaldyNelson Botwinick    Onset Date/Surgical Date  09/07/17    Prior Therapy  about 2 years ago      Precautions   Precautions  None      Balance Screen   Has the patient fallen in the past 6 months  No    Has the patient had a decrease in activity level because of a fear of falling?   No    Is the patient reluctant to leave their home because of a fear of falling?   No      Home Environment   Additional Comments  has a 47 year old, tries to do some housework, drives at times      Prior Function   Level of Independence  Independent    Vocation  On disability  Leisure  no exercises      Posture/Postural Control   Posture Comments  fwd head, rounded shoulders      AROM   AROM Assessment Site  Forearm    Right Elbow Flexion  85    Right Elbow Extension  20    Right/Left Forearm  Right    Right Forearm Pronation  60 Degrees    Right Forearm Supination  60 Degrees    Right Wrist Extension  20 Degrees    Right Wrist Flexion  50 Degrees    Right/Left Thumb  Right    Right Thumb Opposition  Digit 2 very tight and difficulty for him to move      Strength   Overall Strength Comments  right grip 15#, left grip 55#      Flexibility   Soft Tissue Assessment /Muscle Length  -- very tight wrist flexors      Palpation   Palpation comment  he has tightness and tension in the upper trap, very tight in the forearm, very tender with any palpation of the right medial elbow             Objective measurements completed on examination: See above findings.              PT Education - 11/15/17 1119    Education  provided  Yes    Education Details  HEP for wrist flexion and extension, finger opposition and finger abduction/adduction    Person(s) Educated  Patient    Methods  Explanation;Demonstration;Handout    Comprehension  Verbalized understanding;Returned demonstration       PT Short Term Goals - 11/15/17 1134      PT SHORT TERM GOAL #1   Title  independent with initial HEP    Time  2    Period  Weeks    Status  New        PT Long Term Goals - 11/15/17 1134      PT LONG TERM GOAL #1   Title  decrease pain 50%    Time  12    Period  Weeks    Status  New      PT LONG TERM GOAL #2   Title  increase right grip strength to = the left    Time  12    Period  Weeks    Status  New      PT LONG TERM GOAL #3   Title  increase right wrist AROM to WFL's    Time  12    Period  Weeks    Status  New      PT LONG TERM GOAL #4   Title  report independence with dressing and doing hair    Time  12    Period  Weeks    Status  New      PT LONG TERM GOAL #5   Title  increase right elbow ROM to WFL's    Time  12    Period  Weeks    Status  New             Plan - 11/15/17 1131    Clinical Impression Statement  Patient underwent a right ulnar nervetransposition and tenosynovectomy of the 1st and 2nd digits on 09/07/17.  He was in a cast and a sling for 6 weeks.  He reports a high rating of pain, he has limited ROM of the elbow and wrist, he is very weak in the hand  and elbow.  His forearm mms are very tight and limit wrist extension the most.    Clinical Presentation  Evolving    Clinical Presentation due to:  recent surgery    Clinical Decision Making  Low    Rehab Potential  Good    PT Frequency  3x / week    PT Duration  6 weeks    PT Treatment/Interventions  ADLs/Self Care Home Management;Cryotherapy;Electrical Stimulation;Iontophoresis 4mg /ml Dexamethasone;Moist Heat;Therapeutic exercise;Therapeutic activities;Ultrasound;Patient/family education;Manual techniques;Passive  range of motion    PT Next Visit Plan  AROM, PROM, start gentle strength and dexterity    Consulted and Agree with Plan of Care  Patient       Patient will benefit from skilled therapeutic intervention in order to improve the following deficits and impairments:  Decreased range of motion, Decreased scar mobility, Decreased strength, Increased edema, Increased muscle spasms, Postural dysfunction, Improper body mechanics, Impaired sensation, Impaired UE functional use, Pain, Decreased activity tolerance, Increased fascial restricitons, Impaired flexibility  Visit Diagnosis: Pain in right elbow - Plan: PT plan of care cert/re-cert  Stiffness of right elbow, not elsewhere classified - Plan: PT plan of care cert/re-cert  Pain in right hand - Plan: PT plan of care cert/re-cert  Stiffness of right hand, not elsewhere classified - Plan: PT plan of care cert/re-cert     Problem List There are no active problems to display for this patient.   Jearld Lesch., PT 11/15/2017, 11:53 AM  [facility]- [address] [place] [address] [place], [place], [address] Phone: [phone]   Fax:  [phone]  Name: Andre Pittman MRN: [id number] Date of Birth: Nov 06, 1970

## 2017-11-20 ENCOUNTER — Ambulatory Visit: Payer: Worker's Compensation | Attending: Orthopedic Surgery | Admitting: Physical Therapy

## 2017-11-22 ENCOUNTER — Ambulatory Visit: Payer: Worker's Compensation | Admitting: Physical Therapy

## 2017-11-30 ENCOUNTER — Ambulatory Visit: Payer: Worker's Compensation | Attending: Orthopedic Surgery | Admitting: Physical Therapy

## 2017-11-30 ENCOUNTER — Encounter: Payer: Self-pay | Admitting: Physical Therapy

## 2017-11-30 DIAGNOSIS — M25621 Stiffness of right elbow, not elsewhere classified: Secondary | ICD-10-CM

## 2017-11-30 DIAGNOSIS — M79641 Pain in right hand: Secondary | ICD-10-CM

## 2017-11-30 DIAGNOSIS — M25641 Stiffness of right hand, not elsewhere classified: Secondary | ICD-10-CM | POA: Insufficient documentation

## 2017-11-30 DIAGNOSIS — M25521 Pain in right elbow: Secondary | ICD-10-CM | POA: Diagnosis present

## 2017-11-30 NOTE — Therapy (Signed)
[facility] [address] [place], [place], [address] Phone: [phone]   Fax:  [phone]  Physical Therapy Treatment  Patient Details  Name: Andre Pittman MRN: [id number] Date of Birth: 18-Jan-1971 Referring Provider: Violet Baldy   Encounter Date: 11/30/2017  PT End of Session - 11/30/17 1009    Visit Number  2    Date for PT Re-Evaluation  01/13/18    PT Start Time  0930    PT Stop Time  1009    PT Time Calculation (min)  39 min    Activity Tolerance  Patient tolerated treatment well;Patient limited by pain    Behavior During Therapy  [facility] for tasks assessed/performed       History reviewed. No pertinent past medical history.  History reviewed. No pertinent surgical history.  There were no vitals filed for this visit.  Subjective Assessment - 11/30/17 0940    Subjective  "Just some pain and stiffness"    Currently in Pain?  Yes    Pain Score  9     Pain Location  Elbow    Pain Orientation  Right;Left                      [facility] Adult PT Treatment/Exercise - 11/30/17 0001      Exercises   Exercises  Elbow;Wrist;Hand      Elbow Exercises   Elbow Flexion  10 reps;Seated;Right    Bar Weights/Barbell (Elbow Flexion)  1 lb    Elbow Extension  10 reps;Seated;Right    Theraband Level (Elbow Extension)  Level 1 (Yellow)    Forearm Supination  10 reps;Seated;Right    Theraband Level (Supination)  Level 1 (Yellow)    Forearm Pronation  Right;10 reps;Seated;Theraband    Theraband Level (Pronation)  Level 1 (Yellow)    Other elbow exercises  UBE L2 38frd/3rev      Hand Exercises   Other Hand Exercises  orange egg squeezes 2x20      Wrist Exercises   Wrist Flexion  20 reps;Right;Seated;Bar weights/barbell    Bar Weights/Barbell (Wrist Flexion)  1 lb    Wrist Extension  10 reps;Right;Seated    Bar Weights/Barbell (Wrist Extension)  1 lb    Wrist Radial Deviation  Right;10  reps;Seated;Theraband    Theraband Level (Radial Deviation)  Level 1 (Yellow)    Wrist Ulnar Deviation  10 reps;Seated;Theraband    Theraband Level (Ulnar Deviation)  Level 1 (Yellow)      Manual Therapy   Manual Therapy  Passive ROM    Passive ROM  R elbow flex/ext, R hand all directions, pronation/supination               PT Short Term Goals - 11/15/17 1134      PT SHORT TERM GOAL #1   Title  independent with initial HEP    Time  2    Period  Weeks    Status  New        PT Long Term Goals - 11/15/17 1134      PT LONG TERM GOAL #1   Title  decrease pain 50%    Time  12    Period  Weeks    Status  New      PT LONG TERM GOAL #2   Title  increase right grip strength to = the left    Time  12    Period  Weeks    Status  New      PT LONG TERM GOAL #3   Title  increase right wrist AROM to WFL's    Time  12    Period  Weeks    Status  New      PT LONG TERM GOAL #4   Title  report independence with dressing and doing hair    Time  12    Period  Weeks    Status  New      PT LONG TERM GOAL #5   Title  increase right elbow ROM to WFL's    Time  12    Period  Weeks    Status  New            Plan - 11/30/17 1010    Clinical Impression Statement  Pt enters clinic reporting high pain ratting. No increase in pain with today's activity just soreness. Pt reports a burning sensation with all activity. Weakness noted with wrist exercises, putting a lot of slack in theraband.    Rehab Potential  Good    PT Frequency  3x / week    PT Duration  6 weeks    PT Treatment/Interventions  ADLs/Self Care Home Management;Cryotherapy;Electrical Stimulation;Iontophoresis 4mg /ml Dexamethasone;Moist Heat;Therapeutic exercise;Therapeutic activities;Ultrasound;Patient/family education;Manual techniques;Passive range of motion    PT Next Visit Plan  AROM, PROM, start gentle strength and dexterity       Patient will benefit from skilled therapeutic intervention in order to  improve the following deficits and impairments:  Decreased range of motion, Decreased scar mobility, Decreased strength, Increased edema, Increased muscle spasms, Postural dysfunction, Improper body mechanics, Impaired sensation, Impaired UE functional use, Pain, Decreased activity tolerance, Increased fascial restricitons, Impaired flexibility  Visit Diagnosis: Pain in right elbow  Stiffness of right elbow, not elsewhere classified  Stiffness of right hand, not elsewhere classified  Pain in right hand     Problem List There are no active problems to display for this patient.   Grayce Sessionsonald G Alaiah Lundy, PTA 11/30/2017, 10:12 AM  [facility] [address] [place], [place], [address] Phone: [phone]   Fax:  [phone]  Name: Mallie MusselJohnny Pangallo MRN: [id number] Date of Birth: 02/02/1971

## 2017-12-05 ENCOUNTER — Ambulatory Visit: Payer: Worker's Compensation | Admitting: Physical Therapy

## 2017-12-15 ENCOUNTER — Encounter: Payer: Self-pay | Admitting: Physical Therapy

## 2017-12-15 ENCOUNTER — Ambulatory Visit: Payer: Worker's Compensation | Admitting: Physical Therapy

## 2017-12-15 DIAGNOSIS — M79641 Pain in right hand: Secondary | ICD-10-CM

## 2017-12-15 DIAGNOSIS — M25641 Stiffness of right hand, not elsewhere classified: Secondary | ICD-10-CM

## 2017-12-15 DIAGNOSIS — M25521 Pain in right elbow: Secondary | ICD-10-CM | POA: Diagnosis not present

## 2017-12-15 DIAGNOSIS — M25621 Stiffness of right elbow, not elsewhere classified: Secondary | ICD-10-CM

## 2017-12-15 NOTE — Therapy (Signed)
[place], [place], [address] Phone: [phone]   Fax:  [phone]  Physical Therapy Treatment  Patient Details  Name: Andre Pittman MRN: [id number] Date of Birth: March 31, 1971 Referring Provider: Petra Kuba   Encounter Date: 12/15/2017  PT End of Session - 12/15/17 0926    Visit Number  4    Date for PT Re-Evaluation  01/13/18    PT Start Time  0845    PT Stop Time  0926    PT Time Calculation (min)  41 min    Activity Tolerance  Patient tolerated treatment well;Patient limited by pain    Behavior During Therapy  [facility] for tasks assessed/performed       History reviewed. No pertinent past medical history.  History reviewed. No pertinent surgical history.  There were no vitals filed for this visit.  Subjective Assessment - 12/15/17 0843    Subjective  Pt reports that the rain messing with his nerves. Reports getting a epidural in his lower back last week. Wrist still stiffness, discomfort in elbow    Currently in Pain?  Yes    Pain Score  9     Pain Location  -- Elbow wrist,lowe back, shoulder area    Pain Orientation  Right         [facility] PT Assessment - 12/15/17 0001      AROM   Right Elbow Flexion  10    Right Elbow Extension  10    Right Forearm Pronation  80 Degrees    Right Forearm Supination  70 Degrees    Right Wrist Extension  40 Degrees    Right Wrist Flexion  70 Degrees    Right/Left Thumb  Right    Right Thumb Opposition  Digit 2      Strength   Overall Strength Comments  right grip 25#, left grip 55#                  [facility] Adult PT Treatment/Exercise - 12/15/17 0001      Exercises   Exercises  Elbow;Wrist;Hand      Elbow Exercises   Forearm Supination  Seated;Right;20 reps    Theraband Level (Supination)  Level 1 (Yellow)    Forearm Pronation  Right;Seated;Theraband;20 reps    Theraband Level (Pronation)  Level 1 (Yellow)    Other elbow  exercises  UBE L1.5 63fd/3rev    Other elbow exercises  rows yellow 2x10      Hand Exercises   Other Hand Exercises  orange egg squeezes 2x20      Wrist Exercises   Wrist Flexion  20 reps;Right;Seated;Bar weights/barbell    Bar Weights/Barbell (Wrist Flexion)  2 lbs    Wrist Extension  Right;Seated;20 reps    Bar Weights/Barbell (Wrist Extension)  2 lbs    Wrist Radial Deviation  Right;10 reps;Seated;Theraband    Theraband Level (Radial Deviation)  Level 1 (Yellow)    Wrist Ulnar Deviation  10 reps;Seated;Theraband    Theraband Level (Ulnar Deviation)  Level 1 (Yellow)      Manual Therapy   Manual Therapy  Passive ROM    Passive ROM  R elbow flex/ext, R hand all directions, pronation/supination               PT Short Term Goals - 11/15/17 1134      PT SHORT TERM GOAL #1   Title  independent with initial HEP    Time  2  Period  Weeks    Status  New        PT Long Term Goals - 12/15/17 1540      PT LONG TERM GOAL #1   Title  decrease pain 50%    Status  On-going      PT LONG TERM GOAL #2   Title  increase right grip strength to = the left    Status  On-going      PT LONG TERM GOAL #3   Title  increase right wrist AROM to WFL's    Status  Partially Met            Plan - 12/15/17 0926    Clinical Impression Statement  Numbness and pain reported in elbow area with activity. Pt has progressed with bot strength and ROM in elbow and wrist but minium. Pt reports burning in medial elbow area with squeezes.    Rehab Potential  Good    PT Frequency  3x / week    PT Duration  6 weeks    PT Treatment/Interventions  ADLs/Self Care Home Management;Cryotherapy;Electrical Stimulation;Iontophoresis 37m/ml Dexamethasone;Moist Heat;Therapeutic exercise;Therapeutic activities;Ultrasound;Patient/family education;Manual techniques;Passive range of motion    PT Next Visit Plan  AROM, PROM, start gentle strength and dexterity       Patient will benefit from skilled  therapeutic intervention in order to improve the following deficits and impairments:  Decreased range of motion, Decreased scar mobility, Decreased strength, Increased edema, Increased muscle spasms, Postural dysfunction, Improper body mechanics, Impaired sensation, Impaired UE functional use, Pain, Decreased activity tolerance, Increased fascial restricitons, Impaired flexibility  Visit Diagnosis: Stiffness of right elbow, not elsewhere classified  Pain in right elbow  Stiffness of right hand, not elsewhere classified  Pain in right hand     Problem List There are no active problems to display for this patient.   RScot Jun PTA 12/15/2017, 9:29 AM  [place] [address]Phone: [phone]  Fax:  [phone] Name: Andre PhenixMRN: [id number] of Birth: [birth year]

## 2017-12-19 ENCOUNTER — Ambulatory Visit: Payer: Worker's Compensation | Admitting: Physical Therapy

## 2017-12-19 DIAGNOSIS — M79641 Pain in right hand: Secondary | ICD-10-CM

## 2017-12-19 DIAGNOSIS — M25521 Pain in right elbow: Secondary | ICD-10-CM

## 2017-12-19 DIAGNOSIS — M25621 Stiffness of right elbow, not elsewhere classified: Secondary | ICD-10-CM

## 2017-12-19 DIAGNOSIS — M25641 Stiffness of right hand, not elsewhere classified: Secondary | ICD-10-CM

## 2017-12-19 NOTE — Therapy (Signed)
[place], [place], [address] Phone: [phone]   Fax:  [phone]  Physical Therapy Treatment  Patient Details  Name: Andre Pittman MRN: [id number] Date of Birth: [birth year] Referring Provider: Petra Kuba   Encounter Date: 12/19/2017  PT End of Session - 12/19/17 0832    Visit Number  5    Date for PT Re-Evaluation  01/13/18    Authorization Type  [place] W/C    PT Start Time  0805    PT Stop Time  0845    PT Time Calculation (min)  40 min       No past medical history on file.  No past surgical history on file.  There were no vitals filed for this visit.  Subjective Assessment - 12/19/17 0804    Subjective  both hands are locking up with activities like brushing teeth or using remote    Currently in Pain?  Yes    Pain Score  9     Pain Orientation  Right                      [facility] Adult PT Treatment/Exercise - 12/19/17 0001      Elbow Exercises   Other elbow exercises  UBE L 2 3 fwd/3 back pt took multiple rests and verb using Left more      Hand Exercises   Other Hand Exercises  velcro board    Other Hand Exercises  finger web and opposition with orange egg      Wrist Exercises   Wrist Flexion  20 reps;Right;Seated;Bar weights/barbell;Strengthening    Bar Weights/Barbell (Wrist Flexion)  2 lbs    Wrist Extension  Right;20 reps;Standing;Strengthening;Bar weights/barbell    Bar Weights/Barbell (Wrist Extension)  2 lbs    Wrist Radial Deviation  Strengthening;Right;20 reps;Standing;Bar weights/barbell    Bar Weights/Barbell (Radial Deviation)  2 lbs    Wrist Ulnar Deviation  Strengthening;Right;20 reps;Standing;Bar weights/barbell    Bar Weights/Barbell (Ulnar Deviation)  2 lbs    Other wrist exercises  hammer sup/pronate 20       Manual Therapy   Manual Therapy  Passive ROM;Soft tissue mobilization;Neural Stretch    Soft tissue mobilization  wrist ext    Passive ROM  RT wrist/hand and fingers    Neural Stretch  elbow and wrist               PT Short Term Goals - 11/15/17 1134      PT SHORT TERM GOAL #1   Title  independent with initial HEP    Time  2    Period  Weeks    Status  New        PT Long Term Goals - 12/15/17 4492      PT LONG TERM GOAL #1   Title  decrease pain 50%    Status  On-going      PT LONG TERM GOAL #2   Title  increase right grip strength to = the left    Status  On-going      PT LONG TERM GOAL #3   Title  increase right wrist AROM to WFL's    Status  Partially Met            Plan - 12/19/17 0100    Clinical Impression Statement  decreased tolerance to STW to wrist ext. nerve stretches tolerated fair. slow and pain limited with exercises- c/o increased pain and  numbness esp medial elbow    PT Treatment/Interventions  ADLs/Self Care Home Management;Cryotherapy;Electrical Stimulation;Iontophoresis 45m/ml Dexamethasone;Moist Heat;Therapeutic exercise;Therapeutic activities;Ultrasound;Patient/family education;Manual techniques;Passive range of motion    PT Next Visit Plan  AROM, PROM, start gentle strength and dexterity       Patient will benefit from skilled therapeutic intervention in order to improve the following deficits and impairments:  Decreased range of motion, Decreased scar mobility, Decreased strength, Increased edema, Increased muscle spasms, Postural dysfunction, Improper body mechanics, Impaired sensation, Impaired UE functional use, Pain, Decreased activity tolerance, Increased fascial restricitons, Impaired flexibility  Visit Diagnosis: Stiffness of right elbow, not elsewhere classified  Pain in right elbow  Stiffness of right hand, not elsewhere classified  Pain in right hand     Problem List There are no active problems to display for this patient.   Adebayo Ensminger,ANGIE PTA 12/19/2017, 8:35 AM  [place] [address]Phone: [phone]  Fax:  [phone] Name: JLangston TubervilleMRN: [id number] of Birth: [birth year]

## 2017-12-25 ENCOUNTER — Ambulatory Visit: Payer: Worker's Compensation | Attending: Orthopedic Surgery | Admitting: Physical Therapy

## 2017-12-25 ENCOUNTER — Encounter: Payer: Self-pay | Admitting: Physical Therapy

## 2017-12-25 DIAGNOSIS — M25621 Stiffness of right elbow, not elsewhere classified: Secondary | ICD-10-CM | POA: Insufficient documentation

## 2017-12-25 DIAGNOSIS — M25521 Pain in right elbow: Secondary | ICD-10-CM | POA: Diagnosis present

## 2017-12-25 DIAGNOSIS — M79641 Pain in right hand: Secondary | ICD-10-CM | POA: Insufficient documentation

## 2017-12-25 DIAGNOSIS — M25641 Stiffness of right hand, not elsewhere classified: Secondary | ICD-10-CM | POA: Insufficient documentation

## 2017-12-25 NOTE — Therapy (Signed)
[place], [place], [address] Phone: [phone]   Fax:  [phone]  Physical Therapy Treatment  Patient Details  Name: Andre Pittman MRN: [id number] Date of Birth: 10/16/1971 Referring Provider: Petra Kuba   Encounter Date: 12/25/2017  PT End of Session - 12/25/17 0921    Visit Number  6    Date for PT Re-Evaluation  01/13/18    PT Start Time  0841    PT Stop Time  0922    PT Time Calculation (min)  41 min    Activity Tolerance  Patient tolerated treatment well;Patient limited by pain    Behavior During Therapy  [facility] for tasks assessed/performed       History reviewed. No pertinent past medical history.  History reviewed. No pertinent surgical history.  There were no vitals filed for this visit.  Subjective Assessment - 12/25/17 0843    Subjective  Pt reports that this weather is killing him causing his fingers to lock    Currently in Pain?  Yes    Pain Score  9     Pain Location  -- hand, lower back, wrist                       [facility] Adult PT Treatment/Exercise - 12/25/17 0001      Exercises   Exercises  Elbow;Wrist;Hand      Elbow Exercises   Other elbow exercises  UBE L 2 4 fwd/4 back    Other elbow exercises  elbow ext yellow tband x10      Hand Exercises   Other Hand Exercises  finger web and opposition with orange egg      Wrist Exercises   Wrist Flexion  20 reps;Right;Seated;Bar weights/barbell;Strengthening    Bar Weights/Barbell (Wrist Flexion)  2 lbs    Wrist Extension  Right;20 reps;Standing;Strengthening;Bar weights/barbell    Bar Weights/Barbell (Wrist Extension)  2 lbs    Wrist Radial Deviation  Strengthening;Right;20 reps;Standing;Bar weights/barbell    Bar Weights/Barbell (Radial Deviation)  2 lbs    Wrist Ulnar Deviation  Strengthening;Right;20 reps;Standing;Bar weights/barbell    Bar Weights/Barbell (Ulnar Deviation)  2 lbs    Other  wrist exercises  hammer sup/pronate 20       Manual Therapy   Manual Therapy  Passive ROM;Soft tissue mobilization;Neural Stretch    Passive ROM  RT wrist/hand and fingers               PT Short Term Goals - 11/15/17 1134      PT SHORT TERM GOAL #1   Title  independent with initial HEP    Time  2    Period  Weeks    Status  New        PT Long Term Goals - 12/15/17 3202      PT LONG TERM GOAL #1   Title  decrease pain 50%    Status  On-going      PT LONG TERM GOAL #2   Title  increase right grip strength to = the left    Status  On-going      PT LONG TERM GOAL #3   Title  increase right wrist AROM to WFL's    Status  Partially Met            Plan - 12/25/17 0922    Clinical Impression Statement  Pt able pt complete all of today's exercises, does c/o pain  and numbness through out session. Standing activities limited due to c/o increase low back pain.    Rehab Potential  Good    PT Frequency  3x / week    PT Duration  6 weeks    PT Treatment/Interventions  ADLs/Self Care Home Management;Cryotherapy;Electrical Stimulation;Iontophoresis 79m/ml Dexamethasone;Moist Heat;Therapeutic exercise;Therapeutic activities;Ultrasound;Patient/family education;Manual techniques;Passive range of motion    PT Next Visit Plan  AROM, PROM, start gentle strength and dexterity       Patient will benefit from skilled therapeutic intervention in order to improve the following deficits and impairments:  Decreased range of motion, Decreased scar mobility, Decreased strength, Increased edema, Increased muscle spasms, Postural dysfunction, Improper body mechanics, Impaired sensation, Impaired UE functional use, Pain, Decreased activity tolerance, Increased fascial restricitons, Impaired flexibility  Visit Diagnosis: Stiffness of right elbow, not elsewhere classified  Pain in right elbow  Stiffness of right hand, not elsewhere classified  Pain in right hand     Problem  List There are no active problems to display for this patient.   RScot Jun PTA 12/25/2017, 9:24 AM  [place] [address]Phone: [phone]  Fax:  [phone] Name: Andre BramMRN: [id number] of Birth: 4April 30, 1972

## 2018-01-01 ENCOUNTER — Encounter: Payer: Self-pay | Admitting: Physical Therapy

## 2018-01-01 ENCOUNTER — Ambulatory Visit: Payer: Worker's Compensation | Admitting: Physical Therapy

## 2018-01-01 DIAGNOSIS — M79641 Pain in right hand: Secondary | ICD-10-CM

## 2018-01-01 DIAGNOSIS — M25641 Stiffness of right hand, not elsewhere classified: Secondary | ICD-10-CM

## 2018-01-01 DIAGNOSIS — M25621 Stiffness of right elbow, not elsewhere classified: Secondary | ICD-10-CM

## 2018-01-01 DIAGNOSIS — M25521 Pain in right elbow: Secondary | ICD-10-CM

## 2018-01-01 NOTE — Therapy (Signed)
[place], [place], [address] Phone: [phone]   Fax:  [phone]  Physical Therapy Treatment  Patient Details  Name: Carney Saxton MRN: [id number] Date of Birth: 08/16/1971 Referring Provider: Petra Kuba   Encounter Date: 01/01/2018  PT End of Session - 01/01/18 0922    Visit Number  7    Date for PT Re-Evaluation  01/13/18    Authorization Type  [place] W/C    PT Start Time  [phone]    PT Stop Time  0922    PT Time Calculation (min)  40 min    Activity Tolerance  Patient tolerated treatment well;Patient limited by pain    Behavior During Therapy  [facility] for tasks assessed/performed       History reviewed. No pertinent past medical history.  History reviewed. No pertinent surgical history.  There were no vitals filed for this visit.  Subjective Assessment - 01/01/18 0851    Subjective  Pt reports that his back has been hurting more, he also stated increase L side numbness.    Currently in Pain?  Yes    Pain Score  9     Pain Location  -- low back, R elbow shooting down to wrist    Pain Orientation  Right                      [facility] Adult PT Treatment/Exercise - 01/01/18 0001      Elbow Exercises   Elbow Flexion  Right;10 reps;Standing;Bar weights/barbell x2    Bar Weights/Barbell (Elbow Flexion)  3 lbs    Other elbow exercises  UBE L 1 4 fwd/4 back very slow, stops frequenntly     Other elbow exercises  elbow ext yellow tband 2x10      Hand Exercises   Other Hand Exercises  finger web and opposition with orange egg      Wrist Exercises   Wrist Flexion  20 reps;Right;Seated;Bar weights/barbell;Strengthening    Bar Weights/Barbell (Wrist Flexion)  2 lbs    Wrist Extension  Right;20 reps;Standing;Strengthening;Bar weights/barbell    Bar Weights/Barbell (Wrist Extension)  2 lbs    Wrist Radial Deviation  Strengthening;Right;20 reps;Standing;Bar weights/barbell    Wrist Ulnar Deviation  Strengthening;Right;20 reps;Standing;Bar weights/barbell    Bar Weights/Barbell (Ulnar Deviation)  2 lbs    Other wrist exercises  hammer sup/pronate 20       Manual Therapy   Manual Therapy  Passive ROM;Soft tissue mobilization;Neural Stretch    Passive ROM  RT wrist/hand and fingers               PT Short Term Goals - 11/15/17 1134      PT SHORT TERM GOAL #1   Title  independent with initial HEP    Time  2    Period  Weeks    Status  New        PT Long Term Goals - 12/15/17 5093      PT LONG TERM GOAL #1   Title  decrease pain 50%    Status  On-going      PT LONG TERM GOAL #2   Title  increase right grip strength to = the left    Status  On-going      PT LONG TERM GOAL #3   Title  increase right wrist AROM to WFL's    Status  Partially Met  Plan - 01/01/18 0923    Clinical Impression Statement  Pt with c/o increase low back pain and REU numbness. Pt very slow with ube warm up, and reports a burning sensation in R forearm. Pain and numbness reported with all interventions but pt able to able to push through. Pt now with c/o numbness in LUE now.     Rehab Potential  Good    PT Frequency  3x / week    PT Duration  6 weeks    PT Treatment/Interventions  ADLs/Self Care Home Management;Cryotherapy;Electrical Stimulation;Iontophoresis 70m/ml Dexamethasone;Moist Heat;Therapeutic exercise;Therapeutic activities;Ultrasound;Patient/family education;Manual techniques;Passive range of motion    PT Next Visit Plan  AROM, PROM, start gentle strength and dexterity       Patient will benefit from skilled therapeutic intervention in order to improve the following deficits and impairments:  Decreased range of motion, Decreased scar mobility, Decreased strength, Increased edema, Increased muscle spasms, Postural dysfunction, Improper body mechanics, Impaired sensation, Impaired UE functional use, Pain, Decreased activity tolerance, Increased  fascial restricitons, Impaired flexibility  Visit Diagnosis: Stiffness of right elbow, not elsewhere classified  Pain in right elbow  Stiffness of right hand, not elsewhere classified  Pain in right hand     Problem List There are no active problems to display for this patient.   RScot Jun PTA 01/01/2018, 9:25 AM  [place] [address]Phone: [phone]  Fax:  [phone] Name: JRowan BlakerMRN: [id number] of Birth: [birth year]

## 2018-01-08 ENCOUNTER — Encounter: Payer: Self-pay | Admitting: Physical Therapy

## 2018-01-08 ENCOUNTER — Ambulatory Visit: Payer: Worker's Compensation | Admitting: Physical Therapy

## 2018-01-08 DIAGNOSIS — M25621 Stiffness of right elbow, not elsewhere classified: Secondary | ICD-10-CM

## 2018-01-08 DIAGNOSIS — M25641 Stiffness of right hand, not elsewhere classified: Secondary | ICD-10-CM

## 2018-01-08 DIAGNOSIS — M79641 Pain in right hand: Secondary | ICD-10-CM

## 2018-01-08 DIAGNOSIS — M25521 Pain in right elbow: Secondary | ICD-10-CM

## 2018-01-08 NOTE — Therapy (Addendum)
[place], [place], [address] Phone: [phone]   Fax:  [phone]  Physical Therapy Treatment  Patient Details  Name: Andre Pittman MRN: [id number] Date of Birth: [birth year] Referring Provider: Petra Kuba   Encounter Date: 01/08/2018  PT End of Session - 01/08/18 0927    Visit Number  8    Date for PT Re-Evaluation  01/13/18    PT Start Time  0844    PT Stop Time  0927    PT Time Calculation (min)  43 min    Activity Tolerance  Patient tolerated treatment well;Patient limited by pain    Behavior During Therapy  [facility] for tasks assessed/performed       History reviewed. No pertinent past medical history.  History reviewed. No pertinent surgical history.  There were no vitals filed for this visit.  Subjective Assessment - 01/08/18 0845    Subjective  "My knee I feel like I tore something in my knee." "I am undewr the weather, so some fatigue"    Currently in Pain?  Yes    Pain Score  9     Pain Location  -- elbow, hand, neck doew R shoulder, L knee                      [facility] Adult PT Treatment/Exercise - 01/08/18 0001      Exercises   Exercises  Elbow;Wrist;Hand      Elbow Exercises   Elbow Flexion  10 reps;Standing;Bar weights/barbell;Both    Bar Weights/Barbell (Elbow Flexion)  3 lbs    Other elbow exercises  UBE L 1 4 fwd/4 back    Other elbow exercises  Seated Rows 5lb, Lats 15lb  2x10; wall push ups 2x10       Hand Exercises   Other Hand Exercises  velcro board    Other Hand Exercises  finger web and opposition with orange egg      Wrist Exercises   Other wrist exercises  hammer sup/pronate 20                PT Short Term Goals - 11/15/17 1134      PT SHORT TERM GOAL #1   Title  independent with initial HEP    Time  2    Period  Weeks    Status  New        PT Long Term Goals - 12/15/17 3149      PT LONG TERM GOAL #1   Title   decrease pain 50%    Status  On-going      PT LONG TERM GOAL #2   Title  increase right grip strength to = the left    Status  On-going      PT LONG TERM GOAL #3   Title  increase right wrist AROM to WFL's    Status  Partially Met            Plan - 01/08/18 0928    Clinical Impression Statement  Pt continues to reports low back pain and numbness in RUE all the time. Very slow and stops frequently with UBE warm up even with low resistance. Postural cues needed to perform seated rows correctly,     Rehab Potential  Good    PT Frequency  3x / week    PT Duration  6 weeks    PT Treatment/Interventions  ADLs/Self Care Home Management;Cryotherapy;Electrical Stimulation;Iontophoresis 24m/ml  Dexamethasone;Moist Heat;Therapeutic exercise;Therapeutic activities;Ultrasound;Patient/family education;Manual techniques;Passive range of motion    PT Next Visit Plan  AROM, PROM, start gentle strength and dexterity       Patient will benefit from skilled therapeutic intervention in order to improve the following deficits and impairments:  Decreased range of motion, Decreased scar mobility, Decreased strength, Increased edema, Increased muscle spasms, Postural dysfunction, Improper body mechanics, Impaired sensation, Impaired UE functional use, Pain, Decreased activity tolerance, Increased fascial restricitons, Impaired flexibility  Visit Diagnosis: Pain in right elbow  Stiffness of right elbow, not elsewhere classified  Stiffness of right hand, not elsewhere classified  Pain in right hand     Problem List There are no active problems to display for this patient.  PHYSICAL THERAPY DISCHARGE SUMMARY  Visits from Start of Care: 8  Plan: Patient agrees to discharge.  Patient goals were not met. Patient is being discharged due to not returning since the last visit.  ?????     Scot Jun, PTA 01/08/2018, 9:38 AM  [place] [address] [place], [place], [address] Phone: [phone]   Fax:  [phone]  Name: Andre Pittman MRN: [id number] Date of Birth: 22-Mar-1971

## 2018-01-16 ENCOUNTER — Ambulatory Visit: Payer: Worker's Compensation | Admitting: Physical Therapy

## 2018-01-22 ENCOUNTER — Ambulatory Visit: Payer: Worker's Compensation | Attending: Orthopedic Surgery | Admitting: Physical Therapy

## 2018-01-23 ENCOUNTER — Ambulatory Visit: Payer: Worker's Compensation | Admitting: Physical Therapy

## 2018-04-03 IMAGING — DX DG ELBOW COMPLETE 3+V*L*
5 series · 5 of 5 positions shown · non-contrast
Comparison: None.

CLINICAL DATA: Fall 2 months ago, pain, fluid and tenderness
posterior to the left elbow.

EXAM:
LEFT ELBOW - COMPLETE 3+ VIEW

[x elbow ap left]
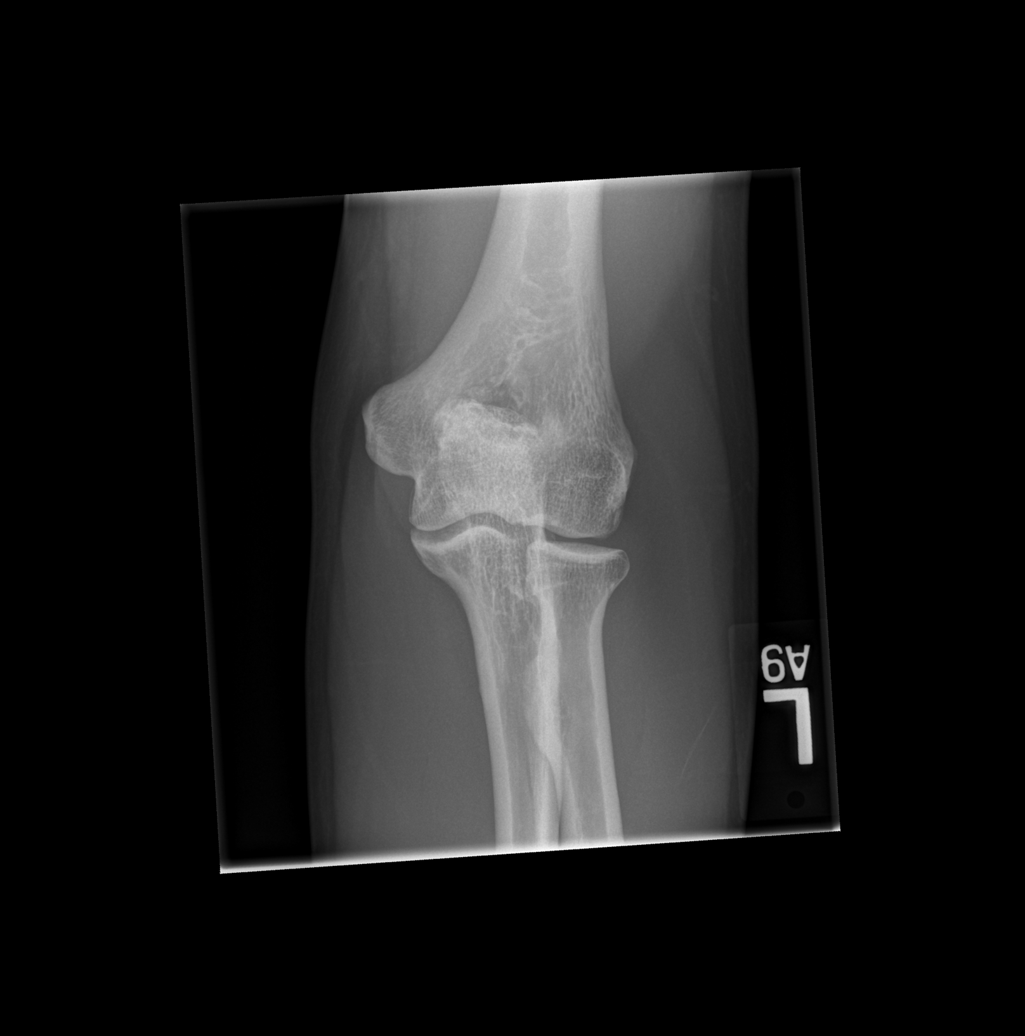

[x elbow obl left (1 of 3)]
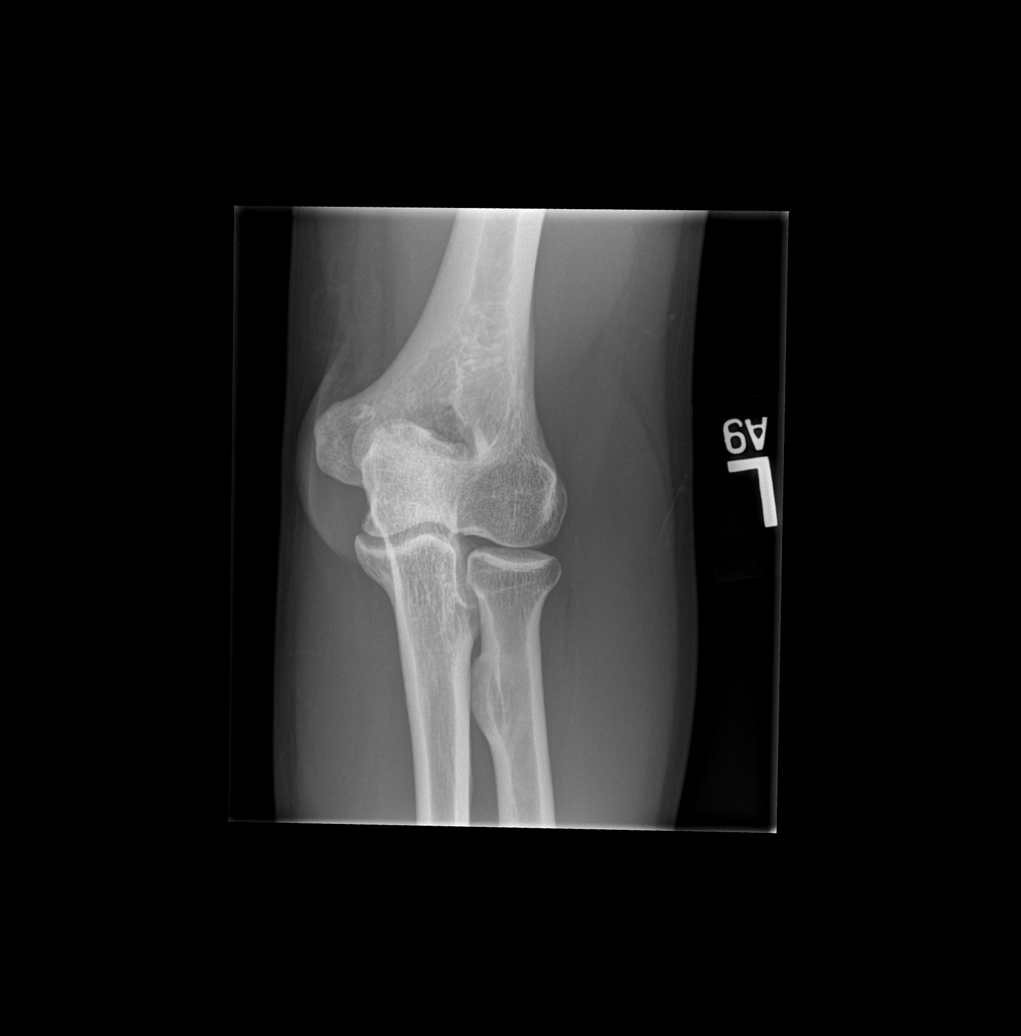

[x elbow obl left (2 of 3)]
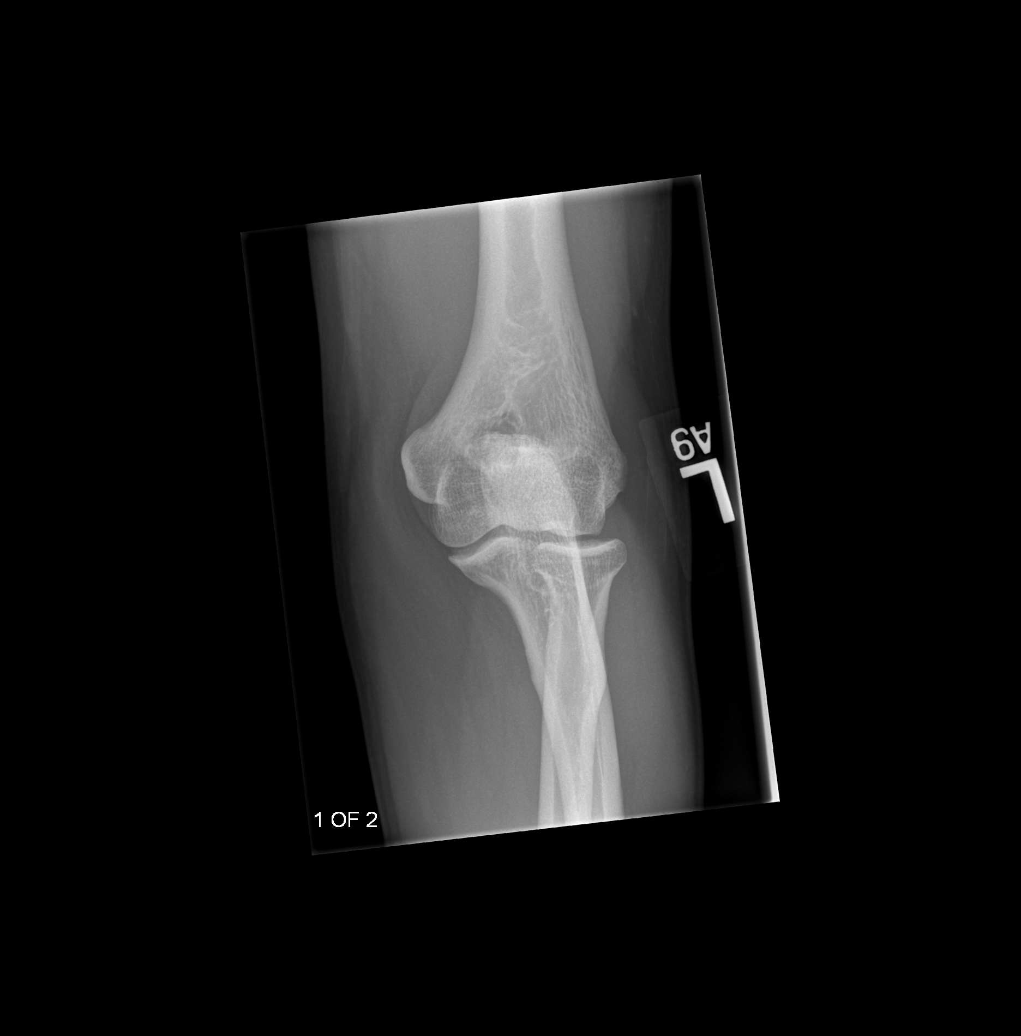

[x elbow lat left]
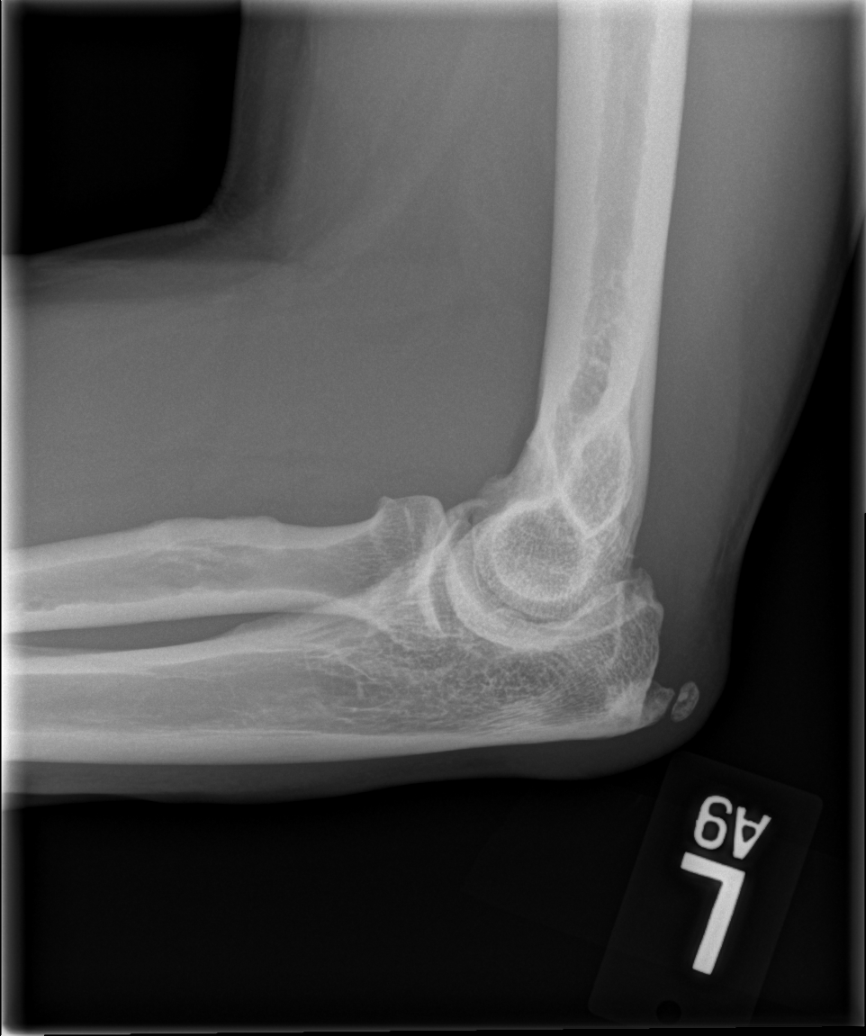

[x elbow obl left (3 of 3)]
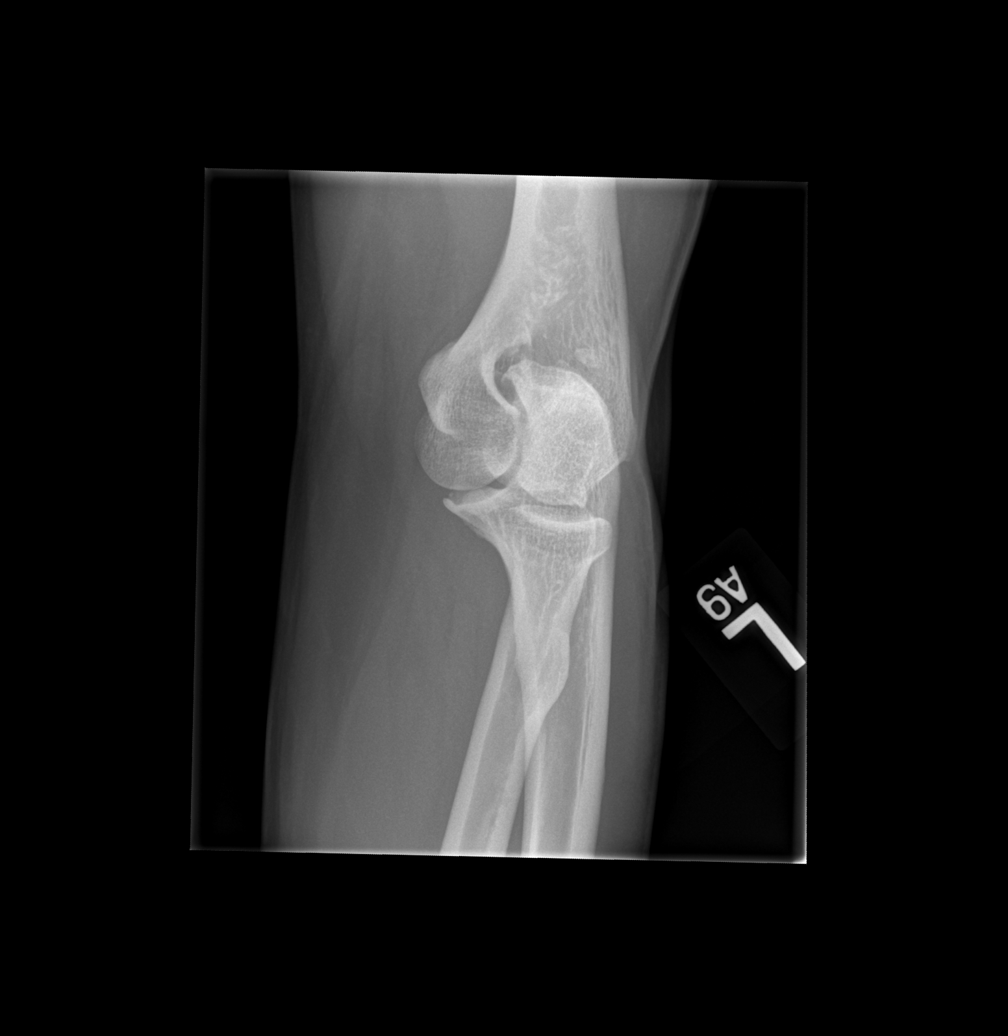

[5 of 5 positions shown; findings below may reference images not displayed]

FINDINGS: There is soft tissue swelling over the olecranon. A spur off the
proximal ulna appears fragmented but well corticated. Degenerative
changes are seen in the elbow joint.
IMPRESSION: 1. Soft tissue swelling over the olecranon can be seen with
olecranon bursitis.
2. Degenerative changes in the elbow joint.

## 2018-10-04 ENCOUNTER — Emergency Department (HOSPITAL_COMMUNITY)
Admission: EM | Admit: 2018-10-04 | Discharge: 2018-10-04 | Disposition: A | Discharge status: Home / Self Care | Payer: Worker's Compensation | Attending: Emergency Medicine | Admitting: Emergency Medicine

## 2018-10-04 ENCOUNTER — Other Ambulatory Visit: Payer: Self-pay

## 2018-10-04 ENCOUNTER — Encounter (HOSPITAL_COMMUNITY): Payer: Self-pay | Admitting: *Deleted

## 2018-10-04 DIAGNOSIS — Z79899 Other long term (current) drug therapy: Secondary | ICD-10-CM | POA: Insufficient documentation

## 2018-10-04 DIAGNOSIS — M5441 Lumbago with sciatica, right side: Secondary | ICD-10-CM | POA: Diagnosis not present

## 2018-10-04 DIAGNOSIS — M545 Low back pain: Secondary | ICD-10-CM | POA: Diagnosis not present

## 2018-10-04 DIAGNOSIS — M5489 Other dorsalgia: Secondary | ICD-10-CM | POA: Diagnosis not present

## 2018-10-04 MED ORDER — DIAZEPAM 10 MG PO TABS: 10.0000 mg | ORAL_TABLET | Freq: Three times a day (TID) | ORAL | 0 refills | Status: AC | PRN

## 2018-10-04 MED ORDER — PREDNISONE 20 MG PO TABS: 20.0000 mg | ORAL_TABLET | Freq: Two times a day (BID) | ORAL | 0 refills | Status: AC

## 2018-10-04 MED ORDER — LORAZEPAM 2 MG/ML IJ SOLN
2.0000 mg | Freq: Once | INTRAMUSCULAR | Status: AC
  Administered 2018-10-04: 2 mg via INTRAMUSCULAR
  Filled 2018-10-04: qty 1

## 2018-10-04 MED ORDER — PREDNISONE 20 MG PO TABS
60.0000 mg | ORAL_TABLET | Freq: Once | ORAL | Status: AC
  Administered 2018-10-04: 60 mg via ORAL
  Filled 2018-10-04: qty 3

## 2018-10-04 NOTE — ED Triage Notes (Signed)
Per EMS pt coming from home with c/o lumbar back pain/spasms x 3 days; pt with hx of chronic back pain due to DDD, hx of neck surgery. Per [facility] pt has tried cold/hot therapy, ibuprofen without relief. Per EMS pt ambulatory with assistance. Off note, pt had an allergic reaction to ibuprofen with   widespread hives that has since resolved with benadryl.

## 2018-10-04 NOTE — Discharge Instructions (Signed)
Use heat on the sore area of your back 3 or 4 times a day.  Follow-up with the doctor of your choice as needed for problems.

## 2018-10-04 NOTE — ED Notes (Signed)
Pt instructed to change into gown. Visitor assisting.

## 2018-10-04 NOTE — ED Provider Notes (Signed)
[facility]-EMERGENCY DEPT Provider Note   CSN: [id number] Arrival date & time: 10/04/18  2057     History   Chief Complaint Chief Complaint  Patient presents with  . Back Pain    HPI Andre Pittman is a 47 y.o. male.  HPI   He reports he has a recurrent spasm sensation in his lower back which radiates to the left posterior thigh.  States he has a known history of degenerative joint disease of the cervical, and lumbar spines.  He does not have a doctor in [place] although he is lived here for 4 years.  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  He is status post surgery on his neck.  He denies recent trauma.  He denies fever, chills, weakness or dizziness.  There are no other known modifying factors.  No past medical history on file.  There are no active problems to display for this patient.   No past surgical history on file.      Home Medications    Prior to Admission medications   Medication Sig Start Date End Date Taking? Authorizing Provider  meloxicam (MOBIC) 15 MG tablet Take 1 tablet (15 mg total) by mouth daily. 08/17/16   Jaynie CrumbleKirichenko, Tatyana, PA-C    Family History No family history on file.  Social History Social History   Tobacco Use  . Smoking status: Never Smoker  . Smokeless tobacco: Never Used  Substance Use Topics  . Alcohol use: No  . Drug use: No     Allergies   Patient has no known allergies.   Review of Systems Review of Systems  All other systems reviewed and are negative.    Physical Exam Updated Vital Signs There were no vitals taken for this visit.  Physical Exam Vitals signs and nursing note reviewed.  Constitutional:      Appearance: Normal appearance. He is well-developed.  HENT:     Head: Normocephalic and atraumatic.     Right Ear: External ear normal.     Left Ear: External ear normal.  Eyes:   Conjunctiva/sclera: Conjunctivae normal.     Pupils: Pupils are equal, round, and reactive to light.  Neck:     Musculoskeletal: Normal range of motion and neck supple.     Trachea: Phonation normal.  Cardiovascular:     Rate and Rhythm: Normal rate.  Pulmonary:     Effort: Pulmonary effort is normal.  Musculoskeletal: Normal range of motion.     Comments: Tender, left right lower lumbar and midline lumbar.  Also mild tenderness to the left buttock.  Skin:    General: Skin is warm and dry.  Neurological:     Mental Status: He is alert and oriented to person, place, and time.     Cranial Nerves: No cranial nerve deficit.     Sensory: No sensory deficit.     Motor: No abnormal muscle tone.     Coordination: Coordination normal.  Psychiatric:        Behavior: Behavior normal.        Thought Content: Thought content normal.        Judgment: Judgment normal.      ED Treatments / Results  Labs (all labs ordered are listed, but only abnormal results are displayed) Labs Reviewed - No data to display  EKG None  Radiology No results found.  Procedures Procedures (including critical care time)  Medications Ordered in ED Medications - No data to display   Initial Impression / Assessment and Plan / ED Course  I have reviewed the triage vital signs and the nursing notes.  Pertinent labs & imaging results that were available during my care of the patient were reviewed by me and considered in my medical decision making (see chart for details).      Patient Vitals for the past 24 hrs:  SpO2  10/04/18 2104 97 %    11:01 PM Reevaluation with update and discussion. After initial assessment and treatment, an updated evaluation reveals he is resting comfortably.  Findings discussed with patient and a male with him.  All questions answered. Mancel Bale   Medical Decision Making: Nonspecific low back pain rating to buttocks consistent with sciatica.  Patient with history of  degenerative spine disease.  Doubt spinal myelopathy.  Doubt fracture.  CRITICAL CARE-no Performed by: Mancel Bale  Nursing Notes Reviewed/ Care Coordinated Applicable Imaging Reviewed Interpretation of Laboratory Data incorporated into ED treatment  The patient appears reasonably screened and/or stabilized for discharge and I doubt any other medical condition or other [facility] requiring further screening, evaluation, or treatment in the ED at this time prior to discharge.  Plan: Home Medications-routine OTC medication; Home Treatments-gradually advance activity, heat therapy; return here if the recommended treatment, does not improve the symptoms; Recommended follow up-PCP, PRN   Final Clinical Impressions(s) / ED Diagnoses   Final diagnoses:  None    ED Discharge Orders    None       Mancel Bale, MD 10/05/18 1348

## 2018-10-04 NOTE — ED Notes (Signed)
Bed: WA01 Expected date:  Expected time:  Means of arrival:  Comments: 34M back spasms

## 2019-05-29 DIAGNOSIS — M19022 Primary osteoarthritis, left elbow: Secondary | ICD-10-CM | POA: Diagnosis not present

## 2019-05-29 DIAGNOSIS — M25522 Pain in left elbow: Secondary | ICD-10-CM | POA: Diagnosis not present

## 2019-05-29 DIAGNOSIS — M7989 Other specified soft tissue disorders: Secondary | ICD-10-CM | POA: Diagnosis not present

## 2019-05-29 DIAGNOSIS — M7022 Olecranon bursitis, left elbow: Secondary | ICD-10-CM | POA: Diagnosis not present

## 2019-07-04 ENCOUNTER — Ambulatory Visit: Payer: Medicare HMO | Admitting: Family Medicine

## 2019-08-22 DIAGNOSIS — M7022 Olecranon bursitis, left elbow: Secondary | ICD-10-CM | POA: Diagnosis not present

## 2019-09-30 DIAGNOSIS — M7022 Olecranon bursitis, left elbow: Secondary | ICD-10-CM | POA: Diagnosis not present

## 2019-10-09 DIAGNOSIS — R1310 Dysphagia, unspecified: Secondary | ICD-10-CM | POA: Diagnosis not present

## 2019-10-09 DIAGNOSIS — M5412 Radiculopathy, cervical region: Secondary | ICD-10-CM | POA: Diagnosis not present

## 2019-10-09 DIAGNOSIS — M5442 Lumbago with sciatica, left side: Secondary | ICD-10-CM | POA: Diagnosis not present

## 2019-10-09 DIAGNOSIS — F17211 Nicotine dependence, cigarettes, in remission: Secondary | ICD-10-CM | POA: Diagnosis not present

## 2019-10-09 DIAGNOSIS — E663 Overweight: Secondary | ICD-10-CM | POA: Diagnosis not present

## 2019-10-09 DIAGNOSIS — M5441 Lumbago with sciatica, right side: Secondary | ICD-10-CM | POA: Diagnosis not present

## 2019-10-09 DIAGNOSIS — Z0000 Encounter for general adult medical examination without abnormal findings: Secondary | ICD-10-CM | POA: Diagnosis not present

## 2019-10-09 DIAGNOSIS — Z79899 Other long term (current) drug therapy: Secondary | ICD-10-CM | POA: Diagnosis not present

## 2019-10-09 DIAGNOSIS — G5601 Carpal tunnel syndrome, right upper limb: Secondary | ICD-10-CM | POA: Diagnosis not present

## 2019-10-11 DIAGNOSIS — Z01818 Encounter for other preprocedural examination: Secondary | ICD-10-CM | POA: Diagnosis not present

## 2019-10-14 ENCOUNTER — Other Ambulatory Visit: Payer: Self-pay

## 2019-10-14 DIAGNOSIS — F17200 Nicotine dependence, unspecified, uncomplicated: Secondary | ICD-10-CM | POA: Diagnosis not present

## 2019-10-14 DIAGNOSIS — M7022 Olecranon bursitis, left elbow: Secondary | ICD-10-CM | POA: Diagnosis not present

## 2019-10-14 DIAGNOSIS — R1310 Dysphagia, unspecified: Secondary | ICD-10-CM

## 2019-10-30 ENCOUNTER — Other Ambulatory Visit: Payer: Medicare HMO

## 2019-10-30 ENCOUNTER — Other Ambulatory Visit: Payer: Self-pay

## 2019-12-04 ENCOUNTER — Other Ambulatory Visit: Payer: Self-pay | Admitting: Family

## 2019-12-11 ENCOUNTER — Ambulatory Visit (HOSPITAL_COMMUNITY): Admission: RE | Admit: 2019-12-11 | Payer: Medicare HMO | Source: Ambulatory Visit

## 2019-12-11 ENCOUNTER — Encounter (HOSPITAL_COMMUNITY): Payer: Self-pay

## 2020-01-22 ENCOUNTER — Encounter: Payer: Self-pay | Admitting: Family

## 2022-10-07 LAB — COLOGUARD: COLOGUARD: NEGATIVE

## 2023-05-05 ENCOUNTER — Ambulatory Visit
Admission: RE | Admit: 2023-05-05 | Discharge: 2023-05-05 | Disposition: A | Discharge status: Home / Self Care | Payer: Medicare HMO | Source: Ambulatory Visit | Attending: Family | Admitting: Family

## 2023-05-05 ENCOUNTER — Other Ambulatory Visit: Payer: Self-pay | Admitting: Family

## 2023-05-05 DIAGNOSIS — M19011 Primary osteoarthritis, right shoulder: Secondary | ICD-10-CM

## 2023-05-08 ENCOUNTER — Ambulatory Visit
Admission: RE | Admit: 2023-05-08 | Discharge: 2023-05-08 | Disposition: A | Discharge status: Home / Self Care | Payer: Medicare HMO | Source: Ambulatory Visit | Attending: Family | Admitting: Family

## 2023-05-08 ENCOUNTER — Other Ambulatory Visit: Payer: Self-pay | Admitting: Family

## 2023-05-08 DIAGNOSIS — M159 Polyosteoarthritis, unspecified: Secondary | ICD-10-CM

## 2023-05-08 DIAGNOSIS — M8949 Other hypertrophic osteoarthropathy, multiple sites: Secondary | ICD-10-CM

## 2023-06-21 ENCOUNTER — Encounter: Payer: Self-pay | Admitting: Family

## 2023-12-09 ENCOUNTER — Encounter (HOSPITAL_BASED_OUTPATIENT_CLINIC_OR_DEPARTMENT_OTHER): Payer: Self-pay

## 2023-12-09 ENCOUNTER — Emergency Department (HOSPITAL_BASED_OUTPATIENT_CLINIC_OR_DEPARTMENT_OTHER)
Admission: EM | Admit: 2023-12-09 | Discharge: 2023-12-09 | Disposition: A | Discharge status: Home / Self Care | Payer: Medicare HMO | Attending: Emergency Medicine | Admitting: Emergency Medicine

## 2023-12-09 DIAGNOSIS — H5789 Other specified disorders of eye and adnexa: Secondary | ICD-10-CM | POA: Diagnosis present

## 2023-12-09 DIAGNOSIS — H01001 Unspecified blepharitis right upper eyelid: Secondary | ICD-10-CM | POA: Diagnosis not present

## 2023-12-09 DIAGNOSIS — H578A1 Foreign body sensation, right eye: Secondary | ICD-10-CM

## 2023-12-09 MED ORDER — FLUORESCEIN SODIUM 1 MG OP STRP
1.0000 | ORAL_STRIP | Freq: Once | OPHTHALMIC | Status: AC
  Administered 2023-12-09: 1 via OPHTHALMIC
  Filled 2023-12-09: qty 1

## 2023-12-09 MED ORDER — ERYTHROMYCIN 5 MG/GM OP OINT: TOPICAL_OINTMENT | OPHTHALMIC | 0 refills | Status: AC

## 2023-12-09 MED ORDER — TETRACAINE HCL 0.5 % OP SOLN
2.0000 [drp] | Freq: Once | OPHTHALMIC | Status: AC
  Administered 2023-12-09: 2 [drp] via OPHTHALMIC
  Filled 2023-12-09: qty 4

## 2023-12-09 NOTE — ED Provider Notes (Signed)
[facility] Provider Note   CSN: [id number] Arrival date & time: 12/09/23  9629     History  Chief Complaint  Patient presents with   Foreign Body in Eye    Andre Pittman is a 53 y.o. male.  Patient is a 53 year old male presenting for foreign body in right eye.  Patient states 2 days ago he was cleaning his child's bed when something from the sheet got into his eye.  He states over the past 2 days he has been scratching and rubbing his eye frequently and feels like the foreign body is still in there.  He denies any vision changes.  He denies any discharge.  Admits to redness and swelling of the upper eyelid.  The history is provided by the patient. No language interpreter was used.  Foreign Body in Eye       Home Medications Prior to Admission medications   Medication Sig Start Date End Date Taking? Authorizing Provider  erythromycin ophthalmic ointment Place a 1/2 inch ribbon of ointment into the lower eyelid. 12/09/23  Yes Edwin Dada P, DO  diazepam (VALIUM) 10 MG tablet Take 1 tablet (10 mg total) by mouth every 8 (eight) hours as needed (Muscle spasm). 10/04/18   Mancel Bale, MD  meloxicam (MOBIC) 15 MG tablet Take 1 tablet (15 mg total) by mouth daily. 08/17/16   Kirichenko, Tatyana, PA-C  predniSONE (DELTASONE) 20 MG tablet Take 1 tablet (20 mg total) by mouth 2 (two) times daily. 10/04/18   Mancel Bale, MD      Allergies    Ibuprofen    Review of Systems   Review of Systems  Constitutional:  Negative for chills and fever.  Eyes:  Positive for pain and redness. Negative for photophobia, discharge, itching and visual disturbance.    Physical Exam Updated Vital Signs BP 130/88 (BP Location: Right Arm)   Pulse 74   Temp 97.7 F (36.5 C)   Resp 18   Ht 5\' 5"  (1.651 m)   Wt 68 kg   SpO2 100%   BMI 24.96 kg/m  Physical Exam Vitals and nursing note reviewed.  Constitutional:      Appearance: Normal  appearance.  HENT:     Head: Normocephalic.  Eyes:     General: Vision grossly intact. Gaze aligned appropriately. No visual field deficit.       Right eye: No foreign body, discharge or hordeolum.     Extraocular Movements: Extraocular movements intact.     Conjunctiva/sclera:     Right eye: Right conjunctiva is injected. No chemosis, exudate or hemorrhage.    Pupils: Pupils are equal, round, and reactive to light.     Right eye: No corneal abrasion or fluorescein uptake. Seidel exam negative.     Visual Fields: Right eye visual fields normal and left eye visual fields normal.  Neurological:     Mental Status: He is alert.     ED Results / Procedures / Treatments   Labs (all labs ordered are listed, but only abnormal results are displayed) Labs Reviewed - No data to display  EKG None  Radiology No results found.  Procedures Procedures    Medications Ordered in ED Medications  tetracaine (PONTOCAINE) 0.5 % ophthalmic solution 2 drop (2 drops Right Eye Given by Other 12/09/23 0755)  fluorescein ophthalmic strip 1 strip (1 strip Right Eye Given by Other 12/09/23 0756)    ED Course/ Medical Decision Making/ A&P  Medical Decision Making Risk Prescription drug management.   53 year old male presenting for foreign body in right eye.  Patient is alert and oriented x 3, no acute distress, afebrile, stable vital signs.  On physical exam patient has a erythematous right eyelid with swelling to the upper eyelid medially.  PERRLA.  Extraocular eye motions intact.  Gross vision intact.  No foreign body visualized in eye or under eyelid.  Tetracaine applied.  Negative Woods lamp exam.  Eyes irrigated and patient discharged with erythromycin ointment for concerns for developing infection post local irritation.  Patient given follow-up instructions with ophthalmology and local physician provided.  Patient in no distress and overall condition improved here  in the ED. Detailed discussions were had with the patient regarding current findings, and need for close f/u with PCP or on call doctor. The patient has been instructed to return immediately if the symptoms worsen in any way for re-evaluation. Patient verbalized understanding and is in agreement with current care plan. All questions answered prior to discharge.         Final Clinical Impression(s) / ED Diagnoses Final diagnoses:  Blepharitis of right upper eyelid, unspecified type  Foreign body sensation, right eye    Rx / DC Orders ED Discharge Orders          Ordered    erythromycin ophthalmic ointment        12/09/23 0859              Franne Forts, DO 12/09/23 0900

## 2023-12-09 NOTE — ED Notes (Signed)
Flushed eyes with 250 ml of sterile water for irrigation. Pt states that right eye feels better.

## 2023-12-09 NOTE — ED Triage Notes (Signed)
Pt reports that he shook a bed sheet the other day and felt something go into his right eye. States that vision is intact but eye is swollen and red.

## 2024-04-06 ENCOUNTER — Encounter (HOSPITAL_BASED_OUTPATIENT_CLINIC_OR_DEPARTMENT_OTHER): Payer: Self-pay | Admitting: Emergency Medicine

## 2024-04-06 ENCOUNTER — Emergency Department (HOSPITAL_BASED_OUTPATIENT_CLINIC_OR_DEPARTMENT_OTHER)
Admission: EM | Admit: 2024-04-06 | Discharge: 2024-04-06 | Disposition: A | Discharge status: Home / Self Care | Attending: Emergency Medicine | Admitting: Emergency Medicine

## 2024-04-06 DIAGNOSIS — R21 Rash and other nonspecific skin eruption: Secondary | ICD-10-CM | POA: Diagnosis present

## 2024-04-06 DIAGNOSIS — L237 Allergic contact dermatitis due to plants, except food: Secondary | ICD-10-CM | POA: Diagnosis not present

## 2024-04-06 MED ORDER — TRIAMCINOLONE ACETONIDE 0.1 % EX CREA: 1.0000 | TOPICAL_CREAM | Freq: Two times a day (BID) | CUTANEOUS | 0 refills | Status: AC

## 2024-04-06 MED ORDER — CEPHALEXIN 500 MG PO CAPS: 1000.0000 mg | ORAL_CAPSULE | Freq: Two times a day (BID) | ORAL | 0 refills | Status: AC

## 2024-04-06 NOTE — ED Triage Notes (Signed)
 Pt reports he thinks a spider bit him on LUE about 4 days ago; several small red bumps noted; bumps are worsening with OTC creams at home

## 2024-04-06 NOTE — Discharge Instructions (Addendum)
 Please take the entire course of antibiotic that I have prescribed.  You can apply the topical steroid twice daily to the affected areas on the arm.  Do not use for any more than 2 weeks to 1 affected site.

## 2024-04-06 NOTE — ED Notes (Signed)
 Discharge instructions reviewed with patient. Patient verbalizes understanding, no further questions at this time. Medications/prescriptions and follow up information provided. No acute distress noted at time of departure.

## 2024-04-06 NOTE — ED Provider Notes (Signed)
 [place] [facility] Provider Note   CSN: [id number] Arrival date & time: 04/06/24  1120     Patient presents with: Insect Bite   Levell Tavano is a 53 y.o. male with overall noncontributory past medical history who presents concern for rash to the left upper extremity around the hand and forearm.  He reports possible spider bites versus poison ivy.  He does not recall a specific contact with poison ivy but reports that it looks similar to poison ivy that he has had in the past.  He does have a dog, and reports he takes his kids to the park.  No significant improvement with calamine and Neosporin.   HPI     Prior to Admission medications   Medication Sig Start Date End Date Taking? Authorizing Provider  cephALEXin (KEFLEX) 500 MG capsule Take 2 capsules (1,000 mg total) by mouth 2 (two) times daily. 04/06/24  Yes Kaisen Ackers H, PA-C  triamcinolone cream (KENALOG) 0.1 % Apply 1 Application topically 2 (two) times daily. Use for no longer than 2 weeks at a time to any one area 04/06/24  Yes Rashena Dowling H, PA-C  diazepam  (VALIUM ) 10 MG tablet Take 1 tablet (10 mg total) by mouth every 8 (eight) hours as needed (Muscle spasm). 10/04/18   Carlton Chick, MD  erythromycin  ophthalmic ointment Place a 1/2 inch ribbon of ointment into the lower eyelid. 12/09/23   Owen Blowers P, DO  meloxicam  (MOBIC ) 15 MG tablet Take 1 tablet (15 mg total) by mouth daily. 08/17/16   Kirichenko, Tatyana, PA-C  predniSONE  (DELTASONE ) 20 MG tablet Take 1 tablet (20 mg total) by mouth 2 (two) times daily. 10/04/18   Carlton Chick, MD    Allergies: Ibuprofen    Review of Systems  All other systems reviewed and are negative.   Updated Vital Signs BP 124/88 (BP Location: Right Arm)   Pulse 85   Temp 98.2 F (36.8 C) (Oral)   Resp 16   Ht 5' 4 (1.626 m)   Wt 64.4 kg   SpO2 100%   BMI 24.37 kg/m   Physical Exam Vitals and nursing note reviewed.   Constitutional:      General: He is not in acute distress.    Appearance: Normal appearance.  HENT:     Head: Normocephalic and atraumatic.   Eyes:     General:        Right eye: No discharge.        Left eye: No discharge.    Cardiovascular:     Rate and Rhythm: Normal rate and regular rhythm.  Pulmonary:     Effort: Pulmonary effort is normal. No respiratory distress.   Musculoskeletal:        General: No deformity.   Skin:    General: Skin is warm and dry.     Comments: Vesicular appearing rash along the left forearm, involving the volar surface of the wrist, a few fingers.  Does not seem to be limited to a dermatomal distribution.  No evidence of significant secondary infection although some of the lesions are somewhat red and irritated from itching.   Neurological:     Mental Status: He is alert and oriented to person, place, and time.   Psychiatric:        Mood and Affect: Mood normal.        Behavior: Behavior normal.     (all labs ordered are listed, but only abnormal results are displayed) Labs  Reviewed - No data to display  EKG: None  Radiology: No results found.   Procedures   Medications Ordered in the ED - No data to display                                  Medical Decision Making  Well-appearing 53 year old male presents with concern for rash to left upper extremity.  My emergency original diagnosis includes insect bite, allergic contact dermatitis, eczema, psoriasis, shingles, versus other.  On physical exam findings do seem consistent with vesicular rash distribution similar to poison ivy or other similar contact dermatitis.  Will plan to treat with triamcinolone, given some redness, irritation in areas where he has been scratching it think reasonable to cover with Keflex as well.  Patient understands and agrees to plan, discharged in stable condition.  Return precautions given for plant dermatitis follow-up.  No evidence of SJS, TEN, no sloughing  lesions, negative Nikolsky sign.  Final diagnoses:  Allergic contact dermatitis due to plants, except food    ED Discharge Orders          Ordered    triamcinolone cream (KENALOG) 0.1 %  2 times daily        04/06/24 1154    cephALEXin (KEFLEX) 500 MG capsule  2 times daily        04/06/24 1154               Travone Georg, [place], PA-C 04/06/24 1159    Mozell Arias, MD 04/06/24 1444

## 2024-04-20 ENCOUNTER — Other Ambulatory Visit: Payer: Self-pay

## 2024-04-20 ENCOUNTER — Encounter (HOSPITAL_BASED_OUTPATIENT_CLINIC_OR_DEPARTMENT_OTHER): Payer: Self-pay | Admitting: Emergency Medicine

## 2024-04-20 ENCOUNTER — Emergency Department (HOSPITAL_BASED_OUTPATIENT_CLINIC_OR_DEPARTMENT_OTHER)
Admission: EM | Admit: 2024-04-20 | Discharge: 2024-04-20 | Disposition: A | Discharge status: Home / Self Care | Attending: Emergency Medicine | Admitting: Emergency Medicine

## 2024-04-20 DIAGNOSIS — W57XXXA Bitten or stung by nonvenomous insect and other nonvenomous arthropods, initial encounter: Secondary | ICD-10-CM | POA: Insufficient documentation

## 2024-04-20 DIAGNOSIS — S90561A Insect bite (nonvenomous), right ankle, initial encounter: Secondary | ICD-10-CM | POA: Insufficient documentation

## 2024-04-20 MED ORDER — ACETAMINOPHEN 500 MG PO TABS
1000.0000 mg | ORAL_TABLET | Freq: Once | ORAL | Status: AC
  Administered 2024-04-20: 1000 mg via ORAL
  Filled 2024-04-20: qty 2

## 2024-04-20 MED ORDER — DOXYCYCLINE HYCLATE 100 MG PO CAPS: 100.0000 mg | ORAL_CAPSULE | Freq: Two times a day (BID) | ORAL | 0 refills | Status: AC

## 2024-04-20 MED ORDER — DOXYCYCLINE HYCLATE 100 MG PO TABS
100.0000 mg | ORAL_TABLET | Freq: Once | ORAL | Status: AC
  Administered 2024-04-20: 100 mg via ORAL
  Filled 2024-04-20: qty 1

## 2024-04-20 MED ORDER — DEXAMETHASONE 4 MG PO TABS
4.0000 mg | ORAL_TABLET | Freq: Once | ORAL | Status: AC
  Administered 2024-04-20: 4 mg via ORAL
  Filled 2024-04-20: qty 1

## 2024-04-20 NOTE — Discharge Instructions (Addendum)
 This is likely a localized reaction to a stinging insect.  You received a dose of a steroid here in the emergency room that will help with the swelling.  I have sent you a prescription for medicine called doxycycline that you can take once in the morning and once in the evening for the next 5 days.  Return to the emergency room if you develop fever, chills, worsening pain in your leg, numbness, tingling or noticed discoloration in your leg.

## 2024-04-20 NOTE — ED Notes (Signed)
 Pt limped from lobby to room, notes his skin feels tight around the sting sight.  Blisters started this morning.  He accidentally popped one this morning with his shoe.  Mild swelling to top of the foot.

## 2024-04-20 NOTE — ED Provider Notes (Signed)
 [place] [facility] [place] Provider Note   CSN: [id number] Arrival date & time: 04/20/24  1031     Patient presents with: Insect Bite   Andre Pittman is a 53 y.o. male.   This is a 53 year old male who is here today with pain in his right ankle after he was stung by a insect while mowing the lawn yesterday.  He says that while he was mowing, an insect flew and stung him on the right ankle.  Did not think much of it at the time.  Patient reports that he woke up this morning and began to have some pain in his left ankle.  He has not had fever, chills.  Has no history of diabetes.        Prior to Admission medications   Medication Sig Start Date End Date Taking? Authorizing Provider  doxycycline (VIBRAMYCIN) 100 MG capsule Take 1 capsule (100 mg total) by mouth 2 (two) times daily for 5 days. 04/20/24 04/25/24 Yes Mannie Pac T, DO  cephALEXin  (KEFLEX ) 500 MG capsule Take 2 capsules (1,000 mg total) by mouth 2 (two) times daily. 04/06/24   Prosperi, Christian H, PA-C  diazepam  (VALIUM ) 10 MG tablet Take 1 tablet (10 mg total) by mouth every 8 (eight) hours as needed (Muscle spasm). 10/04/18   Lorriane Holmes, MD  erythromycin  ophthalmic ointment Place a 1/2 inch ribbon of ointment into the lower eyelid. 12/09/23   Elnor Hila P, DO  meloxicam  (MOBIC ) 15 MG tablet Take 1 tablet (15 mg total) by mouth daily. 08/17/16   Kirichenko, Tatyana, PA-C  predniSONE  (DELTASONE ) 20 MG tablet Take 1 tablet (20 mg total) by mouth 2 (two) times daily. 10/04/18   Lorriane Holmes, MD  triamcinolone  cream (KENALOG ) 0.1 % Apply 1 Application topically 2 (two) times daily. Use for no longer than 2 weeks at a time to any one area 04/06/24   Prosperi, Christian H, PA-C    Allergies: Patient has no active allergies.    Review of Systems  Updated Vital Signs BP 115/87 (BP Location: Left Arm)   Pulse (!) 56   Temp (!) 97.2 F (36.2 C)   Resp 18   Ht 5' 4 (1.626 m)   Wt  63.5 kg   SpO2 95%   BMI 24.03 kg/m   Physical Exam Vitals and nursing note reviewed.  Constitutional:      Appearance: Normal appearance.   Cardiovascular:     Comments: Normal PT and DP pulses  Skin:    Comments: 1+ edema to the level of the inferior gastrocnemius.  1 ruptured bullae on the medial malleolus.  Second bullae I superior to this.  No crepitus.   Neurological:     General: No focal deficit present.     Mental Status: He is alert.     (all labs ordered are listed, but only abnormal results are displayed) Labs Reviewed - No data to display  EKG: None  Radiology: No results found.   Procedures   Medications Ordered in the ED  dexamethasone  (DECADRON ) tablet 4 mg (has no administration in time range)  doxycycline (VIBRA-TABS) tablet 100 mg (has no administration in time range)  acetaminophen (TYLENOL) tablet 1,000 mg (has no administration in time range)                                    Medical Decision Making 52 year old male here  today with pain and swelling following insect sting.  Plan-patient tells me that he was stung by another insect a week ago, did not have much of a reaction.  Believe he was likely sensitized at that time, now is worsening reaction.  No evidence of anaphylaxis.  With the blistering, did consider necrotizing soft tissue infection of the patient, however he is afebrile, normotensive, nontachycardic, exam is more consistent with localized allergic reaction.  Will give him a dose of steroids here in the emergency room.  With the ruptured blister, will send him a prescription for doxycycline.  Return precautions discussed with patient at bedside.  Risk Prescription drug management.        Final diagnoses:  Insect bite of right ankle, initial encounter    ED Discharge Orders          Ordered    doxycycline (VIBRAMYCIN) 100 MG capsule  2 times daily        04/20/24 1153               Mannie Pac T,  DO 04/20/24 1153

## 2024-04-20 NOTE — ED Triage Notes (Signed)
 Pt states he was stung by a bee yesterday at noon while cutting the grass to his right ankle.  Pt states he put some honey on it to get the stinger out.  Pt states it is worsening, noting blisters and swelling.
